# Patient Record
Sex: Female | Born: 1955
Health system: Southern US, Community
[De-identification: ages and names within clinical notes are randomized; demographics above are authoritative.]

## PROBLEM LIST (undated history)

## (undated) DIAGNOSIS — I10 Essential (primary) hypertension: Secondary | ICD-10-CM

## (undated) DIAGNOSIS — J302 Other seasonal allergic rhinitis: Secondary | ICD-10-CM

## (undated) HISTORY — DX: Other seasonal allergic rhinitis: J30.2

## (undated) HISTORY — DX: Essential (primary) hypertension: I10

---

## 1990-05-13 HISTORY — PX: ABDOMINAL HYSTERECTOMY: SHX81

## 2018-03-24 ENCOUNTER — Encounter: Payer: Self-pay | Admitting: Podiatry

## 2018-03-24 ENCOUNTER — Ambulatory Visit: Payer: BC Managed Care – PPO | Admitting: Podiatry

## 2018-03-24 ENCOUNTER — Ambulatory Visit (INDEPENDENT_AMBULATORY_CARE_PROVIDER_SITE_OTHER): Payer: BC Managed Care – PPO

## 2018-03-24 VITALS — BP 129/83 | HR 75 | Resp 16

## 2018-03-24 DIAGNOSIS — M722 Plantar fascial fibromatosis: Secondary | ICD-10-CM

## 2018-03-24 MED ORDER — MELOXICAM 15 MG PO TABS: 15.0000 mg | ORAL_TABLET | Freq: Every day | ORAL | 3 refills | Status: DC

## 2018-03-24 MED ORDER — METHYLPREDNISOLONE 4 MG PO TBPK: ORAL_TABLET | ORAL | 0 refills | Status: DC

## 2018-03-24 NOTE — Patient Instructions (Signed)
For instructions on how to put on your Night Splint, please visit [email or web address]   Plantar Fasciitis (Heel Spur Syndrome) with Rehab The plantar fascia is a fibrous, ligament-like, soft-tissue structure that spans the bottom of the foot. Plantar fasciitis is a condition that causes pain in the foot due to inflammation of the tissue. SYMPTOMS   Pain and tenderness on the underneath side of the foot.  Pain that worsens with standing or walking. CAUSES  Plantar fasciitis is caused by irritation and injury to the plantar fascia on the underneath side of the foot. Common mechanisms of injury include:  Direct trauma to bottom of the foot.  Damage to a small nerve that runs under the foot where the main fascia attaches to the heel bone.  Stress placed on the plantar fascia due to bone spurs. RISK INCREASES WITH:   Activities that place stress on the plantar fascia (running, jumping, pivoting, or cutting).  Poor strength and flexibility.  Improperly fitted shoes.  Tight calf muscles.  Flat feet.  Failure to warm-up properly before activity.  Obesity. PREVENTION  Warm up and stretch properly before activity.  Allow for adequate recovery between workouts.  Maintain physical fitness:  Strength, flexibility, and endurance.  Cardiovascular fitness.  Maintain a health body weight.  Avoid stress on the plantar fascia.  Wear properly fitted shoes, including arch supports for individuals who have flat feet.  PROGNOSIS  If treated properly, then the symptoms of plantar fasciitis usually resolve without surgery. However, occasionally surgery is necessary.  RELATED COMPLICATIONS   Recurrent symptoms that may result in a chronic condition.  Problems of the lower back that are caused by compensating for the injury, such as limping.  Pain or weakness of the foot during push-off following surgery.  Chronic inflammation, scarring, and partial or complete fascia tear,  occurring more often from repeated injections.  TREATMENT  Treatment initially involves the use of ice and medication to help reduce pain and inflammation. The use of strengthening and stretching exercises may help reduce pain with activity, especially stretches of the Achilles tendon. These exercises may be performed at home or with a therapist. Your caregiver may recommend that you use heel cups of arch supports to help reduce stress on the plantar fascia. Occasionally, corticosteroid injections are given to reduce inflammation. If symptoms persist for greater than 6 months despite non-surgical (conservative), then surgery may be recommended.   MEDICATION   If pain medication is necessary, then nonsteroidal anti-inflammatory medications, such as aspirin and ibuprofen, or other minor pain relievers, such as acetaminophen, are often recommended.  Do not take pain medication within 7 days before surgery.  Prescription pain relievers may be given if deemed necessary by your caregiver. Use only as directed and only as much as you need.  Corticosteroid injections may be given by your caregiver. These injections should be reserved for the most serious cases, because they may only be given a certain number of times.  HEAT AND COLD  Cold treatment (icing) relieves pain and reduces inflammation. Cold treatment should be applied for 10 to 15 minutes every 2 to 3 hours for inflammation and pain and immediately after any activity that aggravates your symptoms. Use ice packs or massage the area with a piece of ice (ice massage).  Heat treatment may be used prior to performing the stretching and strengthening activities prescribed by your caregiver, physical therapist, or athletic trainer. Use a heat pack or soak the injury in warm water.  SEEK IMMEDIATE MEDICAL CARE   IF:  Treatment seems to offer no benefit, or the condition worsens.  Any medications produce adverse side effects.  EXERCISES- RANGE OF  MOTION (ROM) AND STRETCHING EXERCISES - Plantar Fasciitis (Heel Spur Syndrome) These exercises may help you when beginning to rehabilitate your injury. Your symptoms may resolve with or without further involvement from your physician, physical therapist or athletic trainer. While completing these exercises, remember:   Restoring tissue flexibility helps normal motion to return to the joints. This allows healthier, less painful movement and activity.  An effective stretch should be held for at least 30 seconds.  A stretch should never be painful. You should only feel a gentle lengthening or release in the stretched tissue.  RANGE OF MOTION - Toe Extension, Flexion  Sit with your right / left leg crossed over your opposite knee.  Grasp your toes and gently pull them back toward the top of your foot. You should feel a stretch on the bottom of your toes and/or foot.  Hold this stretch for 10 seconds.  Now, gently pull your toes toward the bottom of your foot. You should feel a stretch on the top of your toes and or foot.  Hold this stretch for 10 seconds. Repeat  times. Complete this stretch 3 times per day.   RANGE OF MOTION - Ankle Dorsiflexion, Active Assisted  Remove shoes and sit on a chair that is preferably not on a carpeted surface.  Place right / left foot under knee. Extend your opposite leg for support.  Keeping your heel down, slide your right / left foot back toward the chair until you feel a stretch at your ankle or calf. If you do not feel a stretch, slide your bottom forward to the edge of the chair, while still keeping your heel down.  Hold this stretch for 10 seconds. Repeat 3 times. Complete this stretch 2 times per day.   STRETCH  Gastroc, Standing  Place hands on wall.  Extend right / left leg, keeping the front knee somewhat bent.  Slightly point your toes inward on your back foot.  Keeping your right / left heel on the floor and your knee straight, shift  your weight toward the wall, not allowing your back to arch.  You should feel a gentle stretch in the right / left calf. Hold this position for 10 seconds. Repeat 3 times. Complete this stretch 2 times per day.  STRETCH  Soleus, Standing  Place hands on wall.  Extend right / left leg, keeping the other knee somewhat bent.  Slightly point your toes inward on your back foot.  Keep your right / left heel on the floor, bend your back knee, and slightly shift your weight over the back leg so that you feel a gentle stretch deep in your back calf.  Hold this position for 10 seconds. Repeat 3 times. Complete this stretch 2 times per day.  STRETCH  Gastrocsoleus, Standing  Note: This exercise can place a lot of stress on your foot and ankle. Please complete this exercise only if specifically instructed by your caregiver.   Place the ball of your right / left foot on a step, keeping your other foot firmly on the same step.  Hold on to the wall or a rail for balance.  Slowly lift your other foot, allowing your body weight to press your heel down over the edge of the step.  You should feel a stretch in your right / left calf.  Hold this position   for 10 seconds.  Repeat this exercise with a slight bend in your right / left knee. Repeat 3 times. Complete this stretch 2 times per day.   STRENGTHENING EXERCISES - Plantar Fasciitis (Heel Spur Syndrome)  These exercises may help you when beginning to rehabilitate your injury. They may resolve your symptoms with or without further involvement from your physician, physical therapist or athletic trainer. While completing these exercises, remember:   Muscles can gain both the endurance and the strength needed for everyday activities through controlled exercises.  Complete these exercises as instructed by your physician, physical therapist or athletic trainer. Progress the resistance and repetitions only as guided.  STRENGTH - Towel Curls  Sit in  a chair positioned on a non-carpeted surface.  Place your foot on a towel, keeping your heel on the floor.  Pull the towel toward your heel by only curling your toes. Keep your heel on the floor. Repeat 3 times. Complete this exercise 2 times per day.  STRENGTH - Ankle Inversion  Secure one end of a rubber exercise band/tubing to a fixed object (table, pole). Loop the other end around your foot just before your toes.  Place your fists between your knees. This will focus your strengthening at your ankle.  Slowly, pull your big toe up and in, making sure the band/tubing is positioned to resist the entire motion.  Hold this position for 10 seconds.  Have your muscles resist the band/tubing as it slowly pulls your foot back to the starting position. Repeat 3 times. Complete this exercises 2 times per day.  Document Released: 04/29/2005 Document Revised: 07/22/2011 Document Reviewed: 08/11/2008 [employer] Patient Information 2014 [employer].  

## 2018-03-25 NOTE — Progress Notes (Signed)
  Subjective:  Patient ID: Lisa Buckley, female    DOB: [birth year],  MRN: [id number] HPI Chief Complaint  Patient presents with  . Foot Pain    Plantar heel bilateral (R>L) - aching x 3 weeks, AM pain, feeling numbness whole plantar side x months, discoloration medial foot right, tried Aleve  . New Patient (Initial Visit)    62 y.o. female presents with the above complaint.   ROS: Denies fever chills nausea vomiting muscle aches pains calf pain back pain chest pain shortness of breath.  No past medical history on file.   Current Outpatient Medications:  .  naproxen sodium (ALEVE) 220 MG tablet, Take 220 mg by mouth., Disp: , Rfl:  .  meloxicam (MOBIC) 15 MG tablet, Take 1 tablet (15 mg total) by mouth daily., Disp: 30 tablet, Rfl: 3 .  methylPREDNISolone (MEDROL DOSEPAK) 4 MG TBPK tablet, 6 day dose pack - take as directed, Disp: 21 tablet, Rfl: 0 .  telmisartan-hydrochlorothiazide (MICARDIS HCT) 80-25 MG tablet, TK 1 T PO QD, Disp: , Rfl: 1  Allergies  Allergen Reactions  . Morphine And Related    Review of Systems Objective:   Vitals:   03/24/18 1535  BP: 129/83  Pulse: 75  Resp: 16    General: Well developed, nourished, in no acute distress, alert and oriented x3   Dermatological: Skin is warm, dry and supple bilateral. Nails x 10 are well maintained; remaining integument appears unremarkable at this time. There are no open sores, no preulcerative lesions, no rash or signs of infection present.  Vascular: Dorsalis Pedis artery and Posterior Tibial artery pedal pulses are 2/4 bilateral with immedate capillary fill time. Pedal hair growth present. No varicosities and no lower extremity edema present bilateral.   Neruologic: Grossly intact via light touch bilateral. Vibratory intact via tuning fork bilateral. Protective threshold with Semmes Wienstein monofilament intact to all pedal sites bilateral. Patellar and Achilles deep tendon reflexes 2+ bilateral. No Babinski or  clonus noted bilateral.   Musculoskeletal: No gross boney pedal deformities bilateral. No pain, crepitus, or limitation noted with foot and ankle range of motion bilateral. Muscular strength 5/5 in all groups tested bilateral.  Gait: Unassisted, Nonantalgic.    Radiographs:  Radiographs taken today demonstrate an osseously mature individual with mild pes planus soft tissue increase in density plantar calcaneal insertion site of the right heel.  No acute findings.  Assessment & Plan:   Assessment: Plantar fasciitis and pes planus.   Plan: We discussed etiology pathology conservative surgical therapies at this point I injected her right heel today with Kenalog and local anesthetic after sterile Betadine skin prep.  Tolerated procedure well without complications.  Also placed on a Medrol Dosepak to be followed by meloxicam.  Discussed appropriate shoe gear stretching exercise ice therapy sugar modifications dispensed plantar fascial brace and a night splint.     Lisa Buckley T. [place], [place]

## 2018-04-23 ENCOUNTER — Ambulatory Visit: Payer: BC Managed Care – PPO | Admitting: Podiatry

## 2019-08-03 ENCOUNTER — Telehealth: Payer: Self-pay | Admitting: Adult Health

## 2019-08-03 ENCOUNTER — Encounter: Payer: Self-pay | Admitting: Adult Health

## 2019-08-03 NOTE — Telephone Encounter (Signed)
Received a new hem referral from Dr. Nancy Fetter at [facility] for essential thrombocythemia. Lisa Buckley has been cld and scheduled to see Lisa Buckley on 4/12 at 1130am w/labs at 11am. Pt aware to arrive 15 minutes early. Letter mailed.

## 2019-08-09 NOTE — Progress Notes (Addendum)
[place]  [phone] [phone]     ID: Brittanie Dosanjh [facility] DOB: [birth year]  MR#: [id number]  KGY#:[id number]  Patient Care Team: Donald Prose, MD as PCP - General (Family Medicine) Scot Dock, NP OTHER MD:  CHIEF COMPLAINT: thrombocytosis  CURRENT TREATMENT:  Diagnostic work-up   HISTORY OF CURRENT ILLNESS:  Seville has been seeing her PCP annually with labs.  Per Melvine's report, she notes that in 2020 when she went to see Dr. Nancy Fetter, her platelet count was mildly elevated.  The following year, in 07/2019 when she went for her next physical and had her labs drawn again her platelet count was elevated again to 682.  At that point, Gerilynn was referred to see hematology, as her platelet count continued to rise.  There has been no history of infection, iron deficiency, bleeding, or any other lab abnormality.    Her CBC on 08/02/2019 is as follows:  WBC     5.8 RBC     4.52 HGB     14.2 HCT     41.8 MCV     92.5 MCH     31.4 MCHC  34.0 RDW   13.2 PLT      682  The patient's subsequent history is as detailed below.  INTERVAL HISTORY: [place] is feeling well today.  She notes that she is concerned about her elevated platelets, but in general feels well.  She has some foot pain for which she sees podiatry, and has been diagnosed with plantar fasciitis.  She notes she recently had a skin biopsy on 08/12/2019 due to some red marks on her ankle.    REVIEW OF SYSTEMS: Amri denies any fever or chills.  She has occasional night sweats, and rare hot flashes during the day.  She enjoys riding her bike daily.  She denies any lymphadenopathy, unintentional weight loss, bowel/bladder changes, headaches, cough, shortness of breath, chest pain, palpitations, headaches, vision issues, or any other concerns.  A detailed ROS was otherwise non contributory.    PAST MEDICAL HISTORY: Past Medical History:  Diagnosis Date  . HTN (hypertension)   . Seasonal allergies     PAST  SURGICAL HISTORY: Past Surgical History:  Procedure Laterality Date  . ABDOMINAL HYSTERECTOMY  1992    FAMILY HISTORY Family History  Problem Relation Age of Onset  . Hypertension Mother   . Hypertension Father   . Colon cancer Sister   . Colon cancer Brother     GYNECOLOGIC HISTORY:  Menarche: 64 years old Age at first live birth: 64 years old [place] P 1 LMP 1992 Contraceptive no HRT no  Hysterectomy? 1992 Salpingo-oophorectomy? no    SOCIAL HISTORY: Athira is married and lives with her husband in [place], [place].  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  She enjoys riding her bike and denies ETOH, drugs, or current tobacco use.  She is a former smoker and quit in 1992.     ADVANCED DIRECTIVES: not in place   HEALTH MAINTENANCE: Social History   Tobacco Use  . Smoking status: Former Research scientist (life sciences)  . Smokeless tobacco: Never Used  Substance Use Topics  . Alcohol use: Yes    Comment: rarely  . Drug use: Not on file     Colonoscopy: 2018 (every 5 years)  PAP: 2018 (has regular pelvic exams)  Mammo: 08/20/2019   Allergies  Allergen Reactions  . Morphine And Related     Current Outpatient Medications  Medication Sig Dispense  Refill  . cetirizine (ZYRTEC) 10 MG tablet Take 10 mg by mouth daily.    Marland Kitchen esomeprazole (NEXIUM) 40 MG capsule Take 40 mg by mouth daily at 12 noon.    . naproxen sodium (ALEVE) 220 MG tablet Take 220 mg by mouth.    . telmisartan-hydrochlorothiazide (MICARDIS HCT) 80-25 MG tablet TK 1 T PO QD  1  . meloxicam (MOBIC) 15 MG tablet Take 1 tablet (15 mg total) by mouth daily. (Patient not taking: Reported on 08/23/2019) 30 tablet 3  . methylPREDNISolone (MEDROL DOSEPAK) 4 MG TBPK tablet 6 day dose pack - take as directed (Patient not taking: Reported on 08/23/2019) 21 tablet 0   No current facility-administered medications for this visit.    OBJECTIVE:  Vitals:   08/23/19 1220  BP: (!) 158/92  Pulse: 87  Resp: 18  Temp: 98.7  F (37.1 C)  SpO2: 100%     Body mass index is 31.56 kg/m.   Wt Readings from Last 3 Encounters:  08/23/19 192 lb 9.6 oz (87.4 kg)      ECOG FS:0 - Asymptomatic GENERAL: Patient is a well appearing female in no acute distress HEENT:  Sclerae anicteric.  Oropharynx clear and moist. No ulcerations or evidence of oropharyngeal candidiasis. Neck is supple.  NODES:  No cervical, supraclavicular, or axillary lymphadenopathy palpated.  BREAST EXAM:  Deferred. LUNGS:  Clear to auscultation bilaterally.  No wheezes or rhonchi. HEART:  Regular rate and rhythm. No murmur appreciated. ABDOMEN:  Soft, nontender.  Positive, normoactive bowel sounds. No organomegaly palpated. MSK:  No focal spinal tenderness to palpation. EXTREMITIES:  No peripheral edema.   SKIN:  Clear with no obvious rashes or skin changes. No nail dyscrasia. NEURO:  Nonfocal. Well oriented.  Appropriate affect.     LAB RESULTS:  CMP     Component Value Date/Time   NA 141 08/23/2019 1159   K 4.0 08/23/2019 1159   CL 105 08/23/2019 1159   CO2 29 08/23/2019 1159   GLUCOSE 95 08/23/2019 1159   BUN 14 08/23/2019 1159   CREATININE 1.09 (H) 08/23/2019 1159   CALCIUM 10.6 (H) 08/23/2019 1159   PROT 7.2 08/23/2019 1159   ALBUMIN 4.3 08/23/2019 1159   AST 30 08/23/2019 1159   ALT 30 08/23/2019 1159   ALKPHOS 98 08/23/2019 1159   BILITOT 1.7 (H) 08/23/2019 1159   GFRNONAA 54 (L) 08/23/2019 1159   GFRAA >60 08/23/2019 1159    No results found for: TOTALPROTELP, ALBUMINELP, A1GS, A2GS, BETS, BETA2SER, GAMS, MSPIKE, SPEI  No results found for: Nils Pyle, [facility]  Lab Results  Component Value Date   WBC 6.8 08/23/2019   NEUTROABS 4.9 08/23/2019   HGB 14.6 08/23/2019   HCT 45.2 08/23/2019   MCV 94.8 08/23/2019   PLT 890 (H) 08/23/2019      Chemistry      Component Value Date/Time   NA 141 08/23/2019 1159   K 4.0 08/23/2019 1159   CL 105 08/23/2019 1159   CO2 29 08/23/2019 1159   BUN 14  08/23/2019 1159   CREATININE 1.09 (H) 08/23/2019 1159      Component Value Date/Time   CALCIUM 10.6 (H) 08/23/2019 1159   ALKPHOS 98 08/23/2019 1159   AST 30 08/23/2019 1159   ALT 30 08/23/2019 1159   BILITOT 1.7 (H) 08/23/2019 1159       No results found for: LABCA2  No components found for: YQMGNO037  No results for input(s): INR in the last 168 hours.  No results found for: LABCA2  No results found for: PXT062  No results found for: IRS854  No results found for: OEV035  No results found for: CA2729  No components found for: HGQUANT  No results found for: CEA1 / No results found for: CEA1   No results found for: AFPTUMOR  No results found for: CHROMOGRNA  No results found for: PSA1  Appointment on 08/23/2019  Component Date Value Ref Range Status  . CRP 08/23/2019 0.6  <1.0 mg/dL Final   Performed at [facility] [address], [place], [place] [address]  . Sed Rate 08/23/2019 0  0 - 22 mm/hr Final   Performed at [facility] [address] [place], [place] [address]  . Ferritin 08/23/2019 162  11 - 307 ng/mL Final   Performed at [facility], [place] [address] [place], [place] [address]  . Iron 08/23/2019 94  41 - 142 ug/dL Final  . TIBC 08/23/2019 313  236 - 444 ug/dL Final  . Saturation Ratios 08/23/2019 30  21 - 57 % Final  . UIBC 08/23/2019 218  120 - 384 ug/dL Final   Performed at [facility], [place] [address], [place], [place] [address]  . Smear Review 08/23/2019 SMEAR STAINED AND AVAILABLE FOR REVIEW   Final   Performed at [facility], [address], [place], [place] [address]  . Sodium 08/23/2019 141  135 - 145 mmol/L Final  . Potassium 08/23/2019 4.0  3.5 - 5.1 mmol/L Final  . Chloride 08/23/2019 105  98 - 111 mmol/L Final  . CO2 08/23/2019 29  22 - 32 mmol/L Final  . Glucose, Bld 08/23/2019 95  70 - 99 mg/dL Final   Glucose  reference range applies only to samples taken after fasting for at least 8 hours.  . BUN 08/23/2019 14  8 - 23 mg/dL Final  . Creatinine 08/23/2019 1.09* 0.44 - 1.00 mg/dL Final  . Calcium 08/23/2019 10.6* 8.9 - 10.3 mg/dL Final  . Total Protein 08/23/2019 7.2  6.5 - 8.1 g/dL Final  . Albumin 08/23/2019 4.3  3.5 - 5.0 g/dL Final  . AST 08/23/2019 30  15 - 41 U/L Final  . ALT 08/23/2019 30  0 - 44 U/L Final  . Alkaline Phosphatase 08/23/2019 98  38 - 126 U/L Final  . Total Bilirubin 08/23/2019 1.7* 0.3 - 1.2 mg/dL Final  . GFR, Est Non Af Am 08/23/2019 54* >60 mL/min Final  . GFR, Est AFR Am 08/23/2019 >60  >60 mL/min Final  . Anion gap 08/23/2019 7  5 - 15 Final   Performed at [facility], [place] [address] [place],  [address]  . WBC Count 08/23/2019 6.8  4.0 - 10.5 K/uL Final  . RBC 08/23/2019 4.77  3.87 - 5.11 MIL/uL Final  . Hemoglobin 08/23/2019 14.6  12.0 - 15.0 g/dL Final  . HCT 08/23/2019 45.2  36.0 - 46.0 % Final  . MCV 08/23/2019 94.8  80.0 - 100.0 fL Final  . MCH 08/23/2019 30.6  26.0 - 34.0 pg Final  . MCHC 08/23/2019 32.3  30.0 - 36.0 g/dL Final  . RDW 08/23/2019 12.6  11.5 - 15.5 % Final  . Platelet Count 08/23/2019 890* 150 - 400 K/uL Final  . nRBC 08/23/2019 0.0  0.0 - 0.2 % Final  . Neutrophils Relative % 08/23/2019 72  % Final  . Neutro Abs 08/23/2019 4.9  1.7 -  7.7 K/uL Final  . Lymphocytes Relative 08/23/2019 21  % Final  . Lymphs Abs 08/23/2019 1.4  0.7 - 4.0 K/uL Final  . Monocytes Relative 08/23/2019 5  % Final  . Monocytes Absolute 08/23/2019 0.4  0.1 - 1.0 K/uL Final  . Eosinophils Relative 08/23/2019 1  % Final  . Eosinophils Absolute 08/23/2019 0.1  0.0 - 0.5 K/uL Final  . Basophils Relative 08/23/2019 1  % Final  . Basophils Absolute 08/23/2019 0.1  0.0 - 0.1 K/uL Final  . Immature Granulocytes 08/23/2019 0  % Final  . Abs Immature Granulocytes 08/23/2019 0.01  0.00 - 0.07 K/uL Final   Performed at [facility], [place] [address], [place], [place] [address]    (this displays the last labs from the last 3 days)  No results found for: TOTALPROTELP, ALBUMINELP, A1GS, A2GS, BETS, BETA2SER, GAMS, MSPIKE, SPEI (this displays SPEP labs)  No results found for: KPAFRELGTCHN, LAMBDASER, KAPLAMBRATIO (kappa/lambda light chains)  No results found for: HGBA, HGBA2QUANT, HGBFQUANT, HGBSQUAN (Hemoglobinopathy evaluation)   No results found for: LDH  Lab Results  Component Value Date   IRON 94 08/23/2019   TIBC 313 08/23/2019   IRONPCTSAT 30 08/23/2019   (Iron and TIBC)  Lab Results  Component Value Date   FERRITIN 162 08/23/2019    Urinalysis No results found for: COLORURINE, APPEARANCEUR, LABSPEC, PHURINE, GLUCOSEU, HGBUR, BILIRUBINUR, KETONESUR, PROTEINUR, UROBILINOGEN, NITRITE, LEUKOCYTESUR   STUDIES: No results found.    ASSESSMENT: 64 y.o. [place] woman who is here today for evaluation of increasing thrombocytosis.    1.  Thrombocytosis  (a) platelet count 682 on 07/12/2019  (b) platelets are 890 today  (c) suspect Essential Thrombocytosis, Jak2 pending  (d) iron panel, inflammatory markers pending to rule out reactive process  PLAN:  After discussing the above with Dr. Jana Hakim, and reviewing her blood smear, we sat down with York Cerise to review everything.  We reviewed that all blood cells are made in the marrow.  There are 3 main types of blood cells.    1. Red blood cells which carry oxygen.  Aalayah's are normal in number and appearance. They don't appear to be small, so iron deficiency is unlikely.    2.  White blood cells, which are immune cells.  Joneisha's are normal in number, and in appearance.  There is not a sign of leukemia on her blood smear.    3.  Platelets, which are clotting cells.  Anniyah's are increased in number.    Dr. Jana Hakim discussed that we are running several labs to evaluate whether this is reactive thrombocytosis, meaning increased platelets due to another  issue I.e. inflammation, or iron deficiency.    The second possibility is Essential Thrombocytosis; essential meaning, not caused by something else. Dr. Jana Hakim reviewed that essential thrombocytosis or ET is usually associated with an acquired mutation in bone marrow.  There are four genes that can cause this, with JAK2 being the most common.  If this is the diagnosis, then there is concern that Zarayah's platelets are not functioning normally, which can lead to increased clots, or bleeding.  This condition is chronic, however treatable.    Dr. Jana Hakim also reviewed with York Cerise that she will likely need a bone marrow biopsy to evaluate the marrow once we have her labs back.    I will call Jahzara once I receive all of her lab work.  At that point we will decide about doing a bone marrow biopsy.  Once we  have all of the information, she will see Dr. Jana Hakim in follow up.  Adriannah is in agreement with this plan.    She was recommended to continue with the appropriate pandemic precautions. She knows to call for any questions that may arise between now and her next appointment.  We are happy to see her sooner if needed.  Total encounter time: 60 minutes*   Wilber Bihari, NP Medical Oncology and Hematology [facility] [address] [place], [place] [address] Tel. [phone]    Fax. [phone]   ADDENDUM:  Ms Occhipinti is approaching 1 million the platelets, without any obvious provoking cause.  In particular she has a ferritin greater than 100 and a normal iron saturation which means she does not have iron deficiency, the most common cause of a thrombocytosis.  There is no evidence of an inflammatory process, with a normal sed rate and C-reactive protein.  She is not on any medications that might stimulate thrombocytosis.  I think particularly given the normal white cell count and hemoglobin and the absence of any dysplasia or immaturity in the peripheral blood film, she is a good candidate  to have essential thrombocytosis.  We discussed this at length, and she understands that essential thrombocytosis is not curable but is very treatable, that the prognosis is good, and that this is generally due to an acquired mutation, most frequently in JAK2, and that we will need the genetic data before making a definitive diagnosis.  If she does have essential thrombocytosis we will obtain a baseline bone marrow biopsy.  Ms. Eisenhuth had a good understanding of the differential and work-up in process and will see [place] again as soon as we have more definitive data.  Chauncey Cruel MD Medical Oncology and Hematology [facility]  *Total Encounter Time as defined by the Centers for Medicare and Medicaid Services includes, in addition to the face-to-face time of a patient visit (documented in the note above) non-face-to-face time: obtaining and reviewing outside history, ordering and reviewing medications, tests or procedures, care coordination (communications with other health care professionals or caregivers) and documentation in the medical record.

## 2019-08-11 ENCOUNTER — Telehealth: Payer: Self-pay | Admitting: Adult Health

## 2019-08-11 NOTE — Telephone Encounter (Signed)
Lisa Buckley has been cld and made aware that her appts have been moved to 11:30am for labs and 12pm to see Mendel Ryder.

## 2019-08-23 ENCOUNTER — Inpatient Hospital Stay: Payer: BC Managed Care – PPO

## 2019-08-23 ENCOUNTER — Encounter: Payer: Self-pay | Admitting: Oncology

## 2019-08-23 ENCOUNTER — Inpatient Hospital Stay: Payer: BC Managed Care – PPO | Attending: Adult Health | Admitting: Adult Health

## 2019-08-23 ENCOUNTER — Encounter: Payer: Self-pay | Admitting: Adult Health

## 2019-08-23 ENCOUNTER — Telehealth: Payer: Self-pay

## 2019-08-23 ENCOUNTER — Other Ambulatory Visit: Payer: Self-pay

## 2019-08-23 VITALS — BP 158/92 | HR 87 | Temp 98.7°F | Resp 18 | Ht 65.5 in | Wt 192.6 lb

## 2019-08-23 DIAGNOSIS — Z8249 Family history of ischemic heart disease and other diseases of the circulatory system: Secondary | ICD-10-CM | POA: Insufficient documentation

## 2019-08-23 DIAGNOSIS — Z885 Allergy status to narcotic agent status: Secondary | ICD-10-CM | POA: Insufficient documentation

## 2019-08-23 DIAGNOSIS — Z79899 Other long term (current) drug therapy: Secondary | ICD-10-CM | POA: Diagnosis not present

## 2019-08-23 DIAGNOSIS — D473 Essential (hemorrhagic) thrombocythemia: Secondary | ICD-10-CM | POA: Insufficient documentation

## 2019-08-23 DIAGNOSIS — E66811 Obesity, class 1: Secondary | ICD-10-CM

## 2019-08-23 DIAGNOSIS — Z800 Family history of malignant neoplasm of digestive organs: Secondary | ICD-10-CM | POA: Insufficient documentation

## 2019-08-23 DIAGNOSIS — J302 Other seasonal allergic rhinitis: Secondary | ICD-10-CM

## 2019-08-23 DIAGNOSIS — I10 Essential (primary) hypertension: Secondary | ICD-10-CM | POA: Diagnosis not present

## 2019-08-23 DIAGNOSIS — M722 Plantar fascial fibromatosis: Secondary | ICD-10-CM | POA: Diagnosis not present

## 2019-08-23 DIAGNOSIS — R7989 Other specified abnormal findings of blood chemistry: Secondary | ICD-10-CM | POA: Insufficient documentation

## 2019-08-23 DIAGNOSIS — Z87891 Personal history of nicotine dependence: Secondary | ICD-10-CM | POA: Insufficient documentation

## 2019-08-23 DIAGNOSIS — D75839 Thrombocytosis, unspecified: Secondary | ICD-10-CM

## 2019-08-23 DIAGNOSIS — E669 Obesity, unspecified: Secondary | ICD-10-CM | POA: Diagnosis not present

## 2019-08-23 LAB — SAVE SMEAR(SSMR), FOR PROVIDER SLIDE REVIEW

## 2019-08-23 LAB — CBC WITH DIFFERENTIAL (CANCER CENTER ONLY)
Abs Immature Granulocytes: 0.01 10*3/uL (ref 0.00–0.07)
Basophils Absolute: 0.1 10*3/uL (ref 0.0–0.1)
Basophils Relative: 1 %
Eosinophils Absolute: 0.1 10*3/uL (ref 0.0–0.5)
Eosinophils Relative: 1 %
HCT: 45.2 % (ref 36.0–46.0)
Hemoglobin: 14.6 g/dL (ref 12.0–15.0)
Immature Granulocytes: 0 %
Lymphocytes Relative: 21 %
Lymphs Abs: 1.4 10*3/uL (ref 0.7–4.0)
MCH: 30.6 pg (ref 26.0–34.0)
MCHC: 32.3 g/dL (ref 30.0–36.0)
MCV: 94.8 fL (ref 80.0–100.0)
Monocytes Absolute: 0.4 10*3/uL (ref 0.1–1.0)
Monocytes Relative: 5 %
Neutro Abs: 4.9 10*3/uL (ref 1.7–7.7)
Neutrophils Relative %: 72 %
Platelet Count: 890 10*3/uL — ABNORMAL HIGH (ref 150–400)
RBC: 4.77 MIL/uL (ref 3.87–5.11)
RDW: 12.6 % (ref 11.5–15.5)
WBC Count: 6.8 10*3/uL (ref 4.0–10.5)
nRBC: 0 % (ref 0.0–0.2)

## 2019-08-23 LAB — IRON AND TIBC
Iron: 94 ug/dL (ref 41–142)
Saturation Ratios: 30 % (ref 21–57)
TIBC: 313 ug/dL (ref 236–444)
UIBC: 218 ug/dL (ref 120–384)

## 2019-08-23 LAB — CMP (CANCER CENTER ONLY)
ALT: 30 U/L (ref 0–44)
AST: 30 U/L (ref 15–41)
Albumin: 4.3 g/dL (ref 3.5–5.0)
Alkaline Phosphatase: 98 U/L (ref 38–126)
Anion gap: 7 (ref 5–15)
BUN: 14 mg/dL (ref 8–23)
CO2: 29 mmol/L (ref 22–32)
Calcium: 10.6 mg/dL — ABNORMAL HIGH (ref 8.9–10.3)
Chloride: 105 mmol/L (ref 98–111)
Creatinine: 1.09 mg/dL — ABNORMAL HIGH (ref 0.44–1.00)
GFR, Est AFR Am: >60 mL/min (ref >60)
GFR, Estimated: 54 mL/min — ABNORMAL LOW (ref >60)
Glucose, Bld: 95 mg/dL (ref 70–99)
Potassium: 4 mmol/L (ref 3.5–5.1)
Sodium: 141 mmol/L (ref 135–145)
Total Bilirubin: 1.7 mg/dL — ABNORMAL HIGH (ref 0.3–1.2)
Total Protein: 7.2 g/dL (ref 6.5–8.1)

## 2019-08-23 LAB — SEDIMENTATION RATE: Sed Rate: 0 mm/hr (ref 0–22)

## 2019-08-23 LAB — FERRITIN: Ferritin: 162 ng/mL (ref 11–307)

## 2019-08-23 LAB — C-REACTIVE PROTEIN: CRP: 0.6 mg/dL (ref <1.0)

## 2019-08-23 NOTE — Telephone Encounter (Signed)
received critical lab call from lab. PLAT 890 thou. Mendel Ryder made aware.

## 2019-08-24 ENCOUNTER — Telehealth: Payer: Self-pay | Admitting: Adult Health

## 2019-08-24 ENCOUNTER — Encounter: Payer: Self-pay | Admitting: Adult Health

## 2019-08-24 DIAGNOSIS — J302 Other seasonal allergic rhinitis: Secondary | ICD-10-CM | POA: Insufficient documentation

## 2019-08-24 DIAGNOSIS — I10 Essential (primary) hypertension: Secondary | ICD-10-CM | POA: Insufficient documentation

## 2019-08-24 DIAGNOSIS — E669 Obesity, unspecified: Secondary | ICD-10-CM | POA: Insufficient documentation

## 2019-08-24 DIAGNOSIS — D473 Essential (hemorrhagic) thrombocythemia: Secondary | ICD-10-CM | POA: Insufficient documentation

## 2019-08-24 LAB — ANTINUCLEAR ANTIBODIES, IFA: ANA Ab, IFA: NEGATIVE

## 2019-08-24 NOTE — Telephone Encounter (Signed)
No 4/12 los. No changes made to pt's schedule.

## 2019-09-02 ENCOUNTER — Other Ambulatory Visit: Payer: Self-pay | Admitting: Adult Health

## 2019-09-02 ENCOUNTER — Telehealth: Payer: Self-pay | Admitting: Adult Health

## 2019-09-02 DIAGNOSIS — D75839 Thrombocytosis, unspecified: Secondary | ICD-10-CM

## 2019-09-02 DIAGNOSIS — D473 Essential (hemorrhagic) thrombocythemia: Secondary | ICD-10-CM

## 2019-09-02 NOTE — Telephone Encounter (Signed)
Scheduled appt per 4/22 sch message pt is aware of appt date and time   

## 2019-09-02 NOTE — Progress Notes (Signed)
JAK 2 is positive.  I called and reviewed results with her and recommendations for bone marrow biopsy and aspirate.  I reviewed she can do this in radiology and receive sedation, or have it completed at the bedside.  She would prefer sedation, as she is fearful of needles.  I have placed those orders, and she will see Dr. Jana Hakim in 2 weeks for labs and f/u to discuss those results.    Wilber Bihari, NP

## 2019-09-08 ENCOUNTER — Other Ambulatory Visit: Payer: Self-pay | Admitting: Radiology

## 2019-09-09 ENCOUNTER — Ambulatory Visit (HOSPITAL_COMMUNITY)
Admission: RE | Admit: 2019-09-09 | Discharge: 2019-09-09 | Disposition: A | Discharge status: Home / Self Care | Payer: BC Managed Care – PPO | Source: Ambulatory Visit | Attending: Adult Health | Admitting: Adult Health

## 2019-09-09 ENCOUNTER — Other Ambulatory Visit: Payer: Self-pay

## 2019-09-09 ENCOUNTER — Encounter (HOSPITAL_COMMUNITY): Payer: Self-pay

## 2019-09-09 DIAGNOSIS — Z87891 Personal history of nicotine dependence: Secondary | ICD-10-CM | POA: Insufficient documentation

## 2019-09-09 DIAGNOSIS — Z7902 Long term (current) use of antithrombotics/antiplatelets: Secondary | ICD-10-CM | POA: Insufficient documentation

## 2019-09-09 DIAGNOSIS — D473 Essential (hemorrhagic) thrombocythemia: Secondary | ICD-10-CM | POA: Insufficient documentation

## 2019-09-09 DIAGNOSIS — I10 Essential (primary) hypertension: Secondary | ICD-10-CM | POA: Insufficient documentation

## 2019-09-09 DIAGNOSIS — D75839 Thrombocytosis, unspecified: Secondary | ICD-10-CM

## 2019-09-09 DIAGNOSIS — Z79899 Other long term (current) drug therapy: Secondary | ICD-10-CM | POA: Diagnosis not present

## 2019-09-09 LAB — CBC WITH DIFFERENTIAL/PLATELET
Abs Immature Granulocytes: 0.02 10*3/uL (ref 0.00–0.07)
Basophils Absolute: 0.1 10*3/uL (ref 0.0–0.1)
Basophils Relative: 1 %
Eosinophils Absolute: 0.1 10*3/uL (ref 0.0–0.5)
Eosinophils Relative: 2 %
HCT: 44.7 % (ref 36.0–46.0)
Hemoglobin: 15 g/dL (ref 12.0–15.0)
Immature Granulocytes: 0 %
Lymphocytes Relative: 17 %
Lymphs Abs: 1.1 10*3/uL (ref 0.7–4.0)
MCH: 31.7 pg (ref 26.0–34.0)
MCHC: 33.6 g/dL (ref 30.0–36.0)
MCV: 94.5 fL (ref 80.0–100.0)
Monocytes Absolute: 0.5 10*3/uL (ref 0.1–1.0)
Monocytes Relative: 7 %
Neutro Abs: 4.7 10*3/uL (ref 1.7–7.7)
Neutrophils Relative %: 73 %
Platelets: 763 10*3/uL — ABNORMAL HIGH (ref 150–400)
RBC: 4.73 MIL/uL (ref 3.87–5.11)
RDW: 11.9 % (ref 11.5–15.5)
WBC: 6.3 10*3/uL (ref 4.0–10.5)
nRBC: 0 % (ref 0.0–0.2)

## 2019-09-09 LAB — PROTIME-INR
INR: 1.1 (ref 0.8–1.2)
Prothrombin Time: 13.4 seconds (ref 11.4–15.2)

## 2019-09-09 MED ORDER — FENTANYL CITRATE (PF) 100 MCG/2ML IJ SOLN
INTRAMUSCULAR | Status: AC | PRN
  Administered 2019-09-09 (×2): 50 ug via INTRAVENOUS

## 2019-09-09 MED ORDER — LIDOCAINE HCL (PF) 1 % IJ SOLN
INTRAMUSCULAR | Status: AC | PRN
  Administered 2019-09-09: 15 mL

## 2019-09-09 MED ORDER — MIDAZOLAM HCL 2 MG/2ML IJ SOLN
INTRAMUSCULAR | Status: AC
  Filled 2019-09-09: qty 4

## 2019-09-09 MED ORDER — FENTANYL CITRATE (PF) 100 MCG/2ML IJ SOLN
INTRAMUSCULAR | Status: AC
  Filled 2019-09-09: qty 2

## 2019-09-09 MED ORDER — SODIUM CHLORIDE 0.9 % IV SOLN: INTRAVENOUS | Status: DC

## 2019-09-09 MED ORDER — MIDAZOLAM HCL 2 MG/2ML IJ SOLN
INTRAMUSCULAR | Status: AC | PRN
  Administered 2019-09-09 (×3): 1 mg via INTRAVENOUS

## 2019-09-09 NOTE — H&P (Signed)
Chief Complaint: Patient was seen in consultation today for thrombocytosis/bone marrow biopsy and aspiration.  Referring Physician(s): Causey,Lindsey Cornetto  Supervising Physician: Sandi Mariscal  Patient Status: [facility] - Out-pt  History of Present Illness: Lisa Buckley is a 64 y.o. female with a past medical history of hypertension and seasonal allergies. She was referred to oncology by her PCP for management of progressive thrombocytosis seen on annual lab work. Work-up revealed patient was JACK2 positive, indicative of essential thrombocytosis.   IR requested by Wilber Bihari, NP for possible image-guided bone marrow biopsy/aspiration. Patient awake and alert laying in bed with no complaints at this time. Denies fever, chills, chest pain, dyspnea, abdominal pain, or headache.   Past Medical History:  Diagnosis Date  . HTN (hypertension)   . Seasonal allergies     Past Surgical History:  Procedure Laterality Date  . ABDOMINAL HYSTERECTOMY  1992    Allergies: Morphine and related  Medications: Prior to Admission medications   Medication Sig Start Date End Date Taking? Authorizing Provider  cetirizine (ZYRTEC) 10 MG tablet Take 10 mg by mouth daily.   Yes [provider]  esomeprazole (NEXIUM) 40 MG capsule Take 40 mg by mouth daily at 12 noon.   Yes [provider]  naproxen sodium (ALEVE) 220 MG tablet Take 220 mg by mouth.   Yes [provider]  telmisartan-hydrochlorothiazide (MICARDIS HCT) 80-25 MG tablet TK 1 T PO QD 01/17/18  Yes [provider]  meloxicam (MOBIC) 15 MG tablet Take 1 tablet (15 mg total) by mouth daily. Patient not taking: Reported on 08/23/2019 03/24/18   Hyatt, Max T, DPM  methylPREDNISolone (MEDROL DOSEPAK) 4 MG TBPK tablet 6 day dose pack - take as directed Patient not taking: Reported on 08/23/2019 03/24/18   Garrel Ridgel, DPM     Family History  Problem Relation Age of Onset  . Hypertension Mother   .  Hypertension Father   . Colon cancer Sister   . Colon cancer Brother     Social History   Socioeconomic History  . Marital status: Married    Spouse name: Not on file  . Number of children: Not on file  . Years of education: Not on file  . Highest education level: Not on file  Occupational History  . Not on file  Tobacco Use  . Smoking status: Former Research scientist (life sciences)  . Smokeless tobacco: Never Used  Substance and Sexual Activity  . Alcohol use: Yes    Comment: rarely  . Drug use: Not on file  . Sexual activity: Not on file  Other Topics Concern  . Not on file  Social History Narrative  . Not on file   Social Determinants of Health   Financial Resource Strain:   . Difficulty of Paying Living Expenses:   Food Insecurity:   . Worried About Charity fundraiser in the Last Year:   . Arboriculturist in the Last Year:   Transportation Needs:   . Film/video editor (Medical):   Marland Kitchen Lack of Transportation (Non-Medical):   Physical Activity:   . Days of Exercise per Week:   . Minutes of Exercise per Session:   Stress:   . Feeling of Stress :   Social Connections:   . Frequency of Communication with Friends and Family:   . Frequency of Social Gatherings with Friends and Family:   . Attends Religious Services:   . Active Member of Clubs or Organizations:   . Attends Club  or Organization Meetings:   Marland Kitchen Marital Status:      Review of Systems: A 12 point ROS discussed and pertinent positives are indicated in the HPI above.  All other systems are negative.  Review of Systems  Constitutional: Negative for chills and fever.  Respiratory: Negative for shortness of breath and wheezing.   Cardiovascular: Negative for chest pain and palpitations.  Gastrointestinal: Negative for abdominal pain.  Neurological: Negative for headaches.  Psychiatric/Behavioral: Negative for behavioral problems and confusion.    Vital Signs: BP 130/84   Pulse 92   Temp 98.3 F (36.8 C) (Oral)   Resp  18   Ht 5' 5"  (1.651 m)   Wt 189 lb (85.7 kg)   SpO2 99%   BMI 31.45 kg/m   Physical Exam Vitals and nursing note reviewed.  Constitutional:      General: She is not in acute distress.    Appearance: Normal appearance.  Cardiovascular:     Rate and Rhythm: Normal rate and regular rhythm.     Heart sounds: Normal heart sounds. No murmur.  Pulmonary:     Effort: Pulmonary effort is normal. No respiratory distress.     Breath sounds: Normal breath sounds. No wheezing.  Skin:    General: Skin is warm and dry.  Neurological:     Mental Status: She is alert and oriented to person, place, and time.  Psychiatric:        Mood and Affect: Mood normal.        Behavior: Behavior normal.      MD Evaluation Airway: WNL Heart: WNL Abdomen: WNL Chest/ Lungs: WNL ASA  Classification: 2 Mallampati/Airway Score: Two   Imaging: No results found.  Labs:  CBC: Recent Labs    08/23/19 1159 09/09/19 0800  WBC 6.8 6.3  HGB 14.6 15.0  HCT 45.2 44.7  PLT 890* 763*    COAGS: No results for input(s): INR, APTT in the last 8760 hours.  BMP: Recent Labs    08/23/19 1159  NA 141  K 4.0  CL 105  CO2 29  GLUCOSE 95  BUN 14  CALCIUM 10.6*  CREATININE 1.09*  GFRNONAA 54*  GFRAA >60    LIVER FUNCTION TESTS: Recent Labs    08/23/19 1159  BILITOT 1.7*  AST 30  ALT 30  ALKPHOS 98  PROT 7.2  ALBUMIN 4.3     Assessment and Plan:  Thrombocytosis, JAK2 positive. Plan for image-guided bone marrow biopsy/aspiration today in IR. Patient is NPO. Afebrile and WBCs WNL.  Risks and benefits discussed with the patient including, but not limited to bleeding, infection, damage to adjacent structures or low yield requiring additional tests. All of the patient's questions were answered, patient is agreeable to proceed. Consent signed and in chart.   Thank you for this interesting consult.  I greatly enjoyed meeting [facility] and look forward to participating in their  care.  A copy of this report was sent to the requesting provider on this date.  Electronically Signed: Earley Abide, PA-C 09/09/2019, 8:26 AM   I spent a total of 30 Minutes in face to face in clinical consultation, greater than 50% of which was counseling/coordinating care for thrombocytosis/bone marrow biopsy and aspiration.

## 2019-09-09 NOTE — Procedures (Signed)
Pre-procedure Diagnosis: Thrombocytosis Post-procedure Diagnosis: Same  Technically successful CT guided bone marrow aspiration and biopsy of left iliac crest.   Complications: None Immediate  EBL: None  Signed: Sandi Mariscal Pager: [phone] 09/09/2019, 9:19 AM

## 2019-09-09 NOTE — Discharge Instructions (Signed)
  Bone Marrow Aspiration and Bone Marrow Biopsy, Adult, Care After This sheet gives you information about how to care for yourself after your procedure. Your health care provider may also give you more specific instructions. If you have problems or questions, contact your health care provider. What can I expect after the procedure? After the procedure, it is common to have:  Mild pain and tenderness.  Swelling.  Bruising. Follow these instructions at home: Puncture site care   Follow instructions from your health care provider about how to take care of the puncture site. Make sure you: ? Wash your hands with soap and water before and after you change your bandage (dressing). If soap and water are not available, use hand sanitizer. ? Change your dressing as told by your health care provider.  Check your puncture site every day for signs of infection. Check for: ? More redness, swelling, or pain. ? Fluid or blood. ? Warmth. ? Pus or a bad smell. Activity  Return to your normal activities as told by your health care provider. Ask your health care provider what activities are safe for you.  Do not lift anything that is heavier than 10 lb (4.5 kg), or the limit that you are told, until your health care provider says that it is safe.  Do not drive for 24 hours if you were given a sedative during your procedure. General instructions   Take over-the-counter and prescription medicines only as told by your health care provider.  Do not take baths, swim, or use a hot tub until your health care provider approves. Ask your health care provider if you may take showers. You may only be allowed to take sponge baths.  If directed, put ice on the affected area. To do this: ? Put ice in a plastic bag. ? Place a towel between your skin and the bag. ? Leave the ice on for 20 minutes, 2-3 times a day.  Keep all follow-up visits as told by your health care provider. This is important. Contact  a health care provider if:  Your pain is not controlled with medicine.  You have a fever.  You have more redness, swelling, or pain around the puncture site.  You have fluid or blood coming from the puncture site.  Your puncture site feels warm to the touch.  You have pus or a bad smell coming from the puncture site. Summary  After the procedure, it is common to have mild pain, tenderness, swelling, and bruising.  Follow instructions from your health care provider about how to take care of the puncture site and what activities are safe for you.  Take over-the-counter and prescription medicines only as told by your health care provider.  Contact a health care provider if you have any signs of infection, such as fluid or blood coming from the puncture site. This information is not intended to replace advice given to you by your health care provider. Make sure you discuss any questions you have with your health care provider. Document Revised: 09/15/2018 Document Reviewed: 09/15/2018 Elsevier Patient Education  2020 Elsevier Inc. Moderate Conscious Sedation, Adult, Care After These instructions provide you with information about caring for yourself after your procedure. Your health care provider may also give you more specific instructions. Your treatment has been planned according to current medical practices, but problems sometimes occur. Call your health care provider if you have any problems or questions after your procedure. What can I expect after the procedure? After   your procedure, it is common:  To feel sleepy for several hours.  To feel clumsy and have poor balance for several hours.  To have poor judgment for several hours.  To vomit if you eat too soon. Follow these instructions at home: For at least 24 hours after the procedure:   Do not: ? Participate in activities where you could fall or become injured. ? Drive. ? Use heavy machinery. ? Drink alcohol. ? Take  sleeping pills or medicines that cause drowsiness. ? Make important decisions or sign legal documents. ? Take care of children on your own.  Rest. Eating and drinking  Follow the diet recommended by your health care provider.  If you vomit: ? Drink water, juice, or soup when you can drink without vomiting. ? Make sure you have little or no nausea before eating solid foods. General instructions  Have a responsible adult stay with you until you are awake and alert.  Take over-the-counter and prescription medicines only as told by your health care provider.  If you smoke, do not smoke without supervision.  Keep all follow-up visits as told by your health care provider. This is important. Contact a health care provider if:  You keep feeling nauseous or you keep vomiting.  You feel light-headed.  You develop a rash.  You have a fever. Get help right away if:  You have trouble breathing. This information is not intended to replace advice given to you by your health care provider. Make sure you discuss any questions you have with your health care provider. Document Revised: 04/11/2017 Document Reviewed: 08/19/2015 Elsevier Patient Education  2020 Elsevier Inc.  

## 2019-09-10 ENCOUNTER — Other Ambulatory Visit: Payer: Self-pay | Admitting: *Deleted

## 2019-09-12 NOTE — Progress Notes (Signed)
[place]  [phone] [phone]     ID: [facility] DOB: [birth year]  MR#: [id number]  WNU#:[id number]  Patient Care Team: Donald Prose, MD as PCP - General (Family Medicine) Chauncey Cruel, MD OTHER MD:  CHIEF COMPLAINT: essential thrombocytosis  CURRENT TREATMENT:  hydroxyurea   INTERVAL HISTORY: Lisa Buckley returns today for follow up of her thrombocytosis.  Since her last visit, she underwent bone marrow biopsy on 09/09/2019.  This showed a slightly hypercellular marrow with atypical megakaryocytic proliferation but no increase in blasts.  There was no significant increase in reticulin fibers or collagenous fibrosis.  We then obtained genomics on peripheral blood which showed a positive JAK2 mutation.  The patient is here today to discuss her diagnosis of essential thrombocytosis   REVIEW OF SYSTEMS: Lisa Buckley has had both her Pfizer vaccine doses as did her husband.  They tolerated them well with no unusual side effects.  A detailed review of systems today was otherwise noncontributory   HISTORY OF CURRENT ILLNESS: From the original intake note:  Lisa Buckley has been seeing her PCP annually with labs.  Per Lisa Buckley's report, she notes that in 2020 when she went to see Dr. Nancy Fetter, her platelet count was mildly elevated.  The following year, in 07/2019 when she went for her next physical and had her labs drawn again her platelet count was elevated again to 682.  At that point, Lisa Buckley was referred to see hematology, as her platelet count continued to rise.  There has been no history of infection, iron deficiency, bleeding, or any other lab abnormality.    Her CBC on 08/02/2019 is as follows:  WBC     5.8 RBC     4.52 HGB     14.2 HCT     41.8 MCV     92.5 MCH     31.4 MCHC  34.0 RDW   13.2 PLT      682  The patient's subsequent history is as detailed below.    PAST MEDICAL HISTORY: Past Medical History:  Diagnosis Date  . HTN (hypertension)   . Seasonal  allergies     PAST SURGICAL HISTORY: Past Surgical History:  Procedure Laterality Date  . ABDOMINAL HYSTERECTOMY  1992    FAMILY HISTORY Family History  Problem Relation Age of Onset  . Hypertension Mother   . Hypertension Father   . Colon cancer Sister   . Colon cancer Brother     GYNECOLOGIC HISTORY:  Menarche: 64 years old Age at first live birth: 64 years old Lisa Buckley P 1 LMP 1992 Contraceptive no HRT no  Hysterectomy? 1992 Salpingo-oophorectomy? 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  There is son Lisa Buckley runs a [employer] in [place].  The patient has 3 grandchildren aged 63, 42 and 71 as of April 2021.     ADVANCED DIRECTIVES: In the absence of any documents to the contrary the patient's husband is her healthcare power of attorney  HEALTH MAINTENANCE: Social History   Tobacco Use  . Smoking status: Former Research scientist (life sciences)  . Smokeless tobacco: Never Used  Substance Use Topics  . Alcohol use: Yes    Comment: rarely  . Drug use: Not on file     Colonoscopy: 2018 (every 5 years)  PAP: 2018 (has regular pelvic exams)  Mammo: 08/20/2019  Allergies  Allergen Reactions  . Morphine And Related     Current Outpatient Medications  Medication Sig Dispense Refill  . cetirizine (ZYRTEC) 10 MG tablet Take 10 mg by mouth daily.    Marland Kitchen esomeprazole (NEXIUM) 40 MG capsule Take 40 mg by mouth daily at 12 noon.    . meloxicam (MOBIC) 15 MG tablet Take 1 tablet (15 mg total) by mouth daily. (Patient not taking: Reported on 08/23/2019) 30 tablet 3  . methylPREDNISolone (MEDROL DOSEPAK) 4 MG TBPK tablet 6 day dose pack - take as directed (Patient not taking: Reported on 08/23/2019) 21 tablet 0  . naproxen sodium (ALEVE) 220 MG tablet Take 220 mg by mouth.    . telmisartan-hydrochlorothiazide (MICARDIS HCT) 80-25 MG  tablet TK 1 T PO QD  1   No current facility-administered medications for this visit.    OBJECTIVE: African-American woman in no acute distress  Vitals:   09/13/19 1525  BP: (!) 159/77  Pulse: 88  Resp: 20  Temp: 98.5 F (36.9 C)  SpO2: 100%     Body mass index is 32.48 kg/m.   Wt Readings from Last 3 Encounters:  09/13/19 195 lb 3.2 oz (88.5 kg)  09/09/19 189 lb (85.7 kg)  08/23/19 192 lb 9.6 oz (87.4 kg)      ECOG FS:0 - Asymptomatic  LAB RESULTS:  CMP     Component Value Date/Time   NA 141 08/23/2019 1159   K 4.0 08/23/2019 1159   CL 105 08/23/2019 1159   CO2 29 08/23/2019 1159   GLUCOSE 95 08/23/2019 1159   BUN 14 08/23/2019 1159   CREATININE 1.09 (H) 08/23/2019 1159   CALCIUM 10.6 (H) 08/23/2019 1159   PROT 7.2 08/23/2019 1159   ALBUMIN 4.3 08/23/2019 1159   AST 30 08/23/2019 1159   ALT 30 08/23/2019 1159   ALKPHOS 98 08/23/2019 1159   BILITOT 1.7 (H) 08/23/2019 1159   GFRNONAA 54 (L) 08/23/2019 1159   GFRAA >60 08/23/2019 1159    No results found for: TOTALPROTELP, ALBUMINELP, A1GS, A2GS, BETS, BETA2SER, GAMS, MSPIKE, SPEI  No results found for: Nils Pyle, [facility]  Lab Results  Component Value Date   WBC 8.3 09/13/2019   NEUTROABS 6.2 09/13/2019   HGB 13.8 09/13/2019   HCT 42.9 09/13/2019   MCV 95.5 09/13/2019   PLT 578 (H) 09/13/2019      Chemistry      Component Value Date/Time   NA 141 08/23/2019 1159   K 4.0 08/23/2019 1159   CL 105 08/23/2019 1159   CO2 29 08/23/2019 1159   BUN 14 08/23/2019 1159   CREATININE 1.09 (H) 08/23/2019 1159      Component Value Date/Time   CALCIUM 10.6 (H) 08/23/2019 1159   ALKPHOS 98 08/23/2019 1159   AST 30 08/23/2019 1159   ALT 30 08/23/2019 1159   BILITOT 1.7 (H) 08/23/2019 1159       No results found for: LABCA2  No components found for: [id number]  Recent Labs  Lab 09/09/19 0800  INR 1.1    No results found for: LABCA2  No results found for: ZSM270  No results  found for: BEM754  No results found for: GBE010  No results found for: CA2729  No components found for: HGQUANT  No results found for: CEA1 / No results found for: CEA1   No results found for: AFPTUMOR  No results found for: CHROMOGRNA  No results found for: HGBA, HGBA2QUANT, HGBFQUANT, HGBSQUAN (Hemoglobinopathy evaluation)   No results  found for: LDH  Lab Results  Component Value Date   IRON 94 08/23/2019   TIBC 313 08/23/2019   IRONPCTSAT 30 08/23/2019   (Iron and TIBC)  Lab Results  Component Value Date   FERRITIN 162 08/23/2019    Urinalysis No results found for: COLORURINE, APPEARANCEUR, LABSPEC, PHURINE, GLUCOSEU, HGBUR, BILIRUBINUR, KETONESUR, PROTEINUR, UROBILINOGEN, NITRITE, LEUKOCYTESUR   STUDIES: CT Biopsy  Result Date: Sep 13, 2019 INDICATION: Essential thrombocytosis. Please perform CT-guided bone marrow biopsy for tissue diagnostic purposes. EXAM: CT-GUIDED BONE MARROW BIOPSY AND ASPIRATION MEDICATIONS: None ANESTHESIA/SEDATION: Fentanyl 100 mcg IV; Versed 3 mg IV Sedation Time: 10 Minutes; The patient was continuously monitored during the procedure by the interventional radiology nurse under my direct supervision. COMPLICATIONS: None immediate. PROCEDURE: Informed consent was obtained from the patient following an explanation of the procedure, risks, benefits and alternatives. The patient understands, agrees and consents for the procedure. All questions were addressed. A time out was performed prior to the initiation of the procedure. The patient was positioned prone and non-contrast localization CT was performed of the pelvis to demonstrate the iliac marrow spaces. The operative site was prepped and draped in the usual sterile fashion. Under sterile conditions and local anesthesia, a 22 gauge spinal needle was utilized for procedural planning. Next, an 11 gauge coaxial bone biopsy needle was advanced into the left iliac marrow space. Needle position was confirmed  with CT imaging. Initially, a bone marrow aspiration was performed. Next, a bone marrow biopsy was obtained with the 11 gauge outer bone marrow device. Samples were prepared with the cytotechnologist and deemed adequate. The needle was removed and superficial hemostasis was obtained with manual compression. A dressing was applied. The patient tolerated the procedure well without immediate post procedural complication. IMPRESSION: Successful CT guided left iliac bone marrow aspiration and core biopsy. Electronically Signed   By: Sandi Mariscal M.D.   On: 09-13-19 10:05   CT BONE MARROW BIOPSY & ASPIRATION  Result Date: 13-Sep-2019 INDICATION: Essential thrombocytosis. Please perform CT-guided bone marrow biopsy for tissue diagnostic purposes. EXAM: CT-GUIDED BONE MARROW BIOPSY AND ASPIRATION MEDICATIONS: None ANESTHESIA/SEDATION: Fentanyl 100 mcg IV; Versed 3 mg IV Sedation Time: 10 Minutes; The patient was continuously monitored during the procedure by the interventional radiology nurse under my direct supervision. COMPLICATIONS: None immediate. PROCEDURE: Informed consent was obtained from the patient following an explanation of the procedure, risks, benefits and alternatives. The patient understands, agrees and consents for the procedure. All questions were addressed. A time out was performed prior to the initiation of the procedure. The patient was positioned prone and non-contrast localization CT was performed of the pelvis to demonstrate the iliac marrow spaces. The operative site was prepped and draped in the usual sterile fashion. Under sterile conditions and local anesthesia, a 22 gauge spinal needle was utilized for procedural planning. Next, an 11 gauge coaxial bone biopsy needle was advanced into the left iliac marrow space. Needle position was confirmed with CT imaging. Initially, a bone marrow aspiration was performed. Next, a bone marrow biopsy was obtained with the 11 gauge outer bone marrow device.  Samples were prepared with the cytotechnologist and deemed adequate. The needle was removed and superficial hemostasis was obtained with manual compression. A dressing was applied. The patient tolerated the procedure well without immediate post procedural complication. IMPRESSION: Successful CT guided left iliac bone marrow aspiration and core biopsy. Electronically Signed   By: Sandi Mariscal M.D.   On: 09-13-2019 10:05     ASSESSMENT: 64 y.o. [place] woman  who is here today for evaluation of increasing thrombocytosis.    (1) essential thrombocytosis  (a) platelet count 682 on 07/12/2019, repeat 08/23/2019 was 890  (b) screening studies showed normal red cell and white cells number and morphology, normal iron studies, normal C-reactive protein and sedimentation rate and negative ANA  (c) bone marrow biopsy 09/09/2019 shows an increase in atypical megakaryocytes, no white cell dysplasia or increase in blasts, no increase in reticulin or evidence of fibrosis, present iron stores  (d) genomics 09/02/2019 shows a mutation in JAK2 p.Val617Phe; no mutations in JAK2 exon 12, MPL or CALR  (2) Hydrea started 09/13/2019 at 500 mg daily   PLAN: I gave Geana a copy of her lab work, bone marrow biopsy report, and genomics report and we spent some time going over it so she would understand it.  She knows that essential thrombocytosis means the rise in platelets is not due to something extraneous such as for example iron deficiency.  It is intrinsic or essential to the platelets themselves and it is due to a mutation in one of the platelet precursors.  That mutation is called JAK2 and it is not something she was born with, inherited, or can pass on to her children.  The mutation is due to an error in one of the platelet precursors called megakaryocytes and this does lead to uncontrolled platelet proliferation, basically too many platelets.  Concerns regarding essential thrombocytosis include both clotting and  bleeding.  The clotting may occur because there are too many platelets, which are clotting cells.  The bleeding may occur because the platelets may not be functionally normal.  In addition if allowed to run uncontrolled essential thrombocytosis can cause fibrosis of the bone marrow.  This means the bone marrow is scarred up, cannot make normal cells, and at that time the rare possibility of leukemic transformation can occur.  It is important to note that development of this process may take many decades and that if the platelet count is controlled as we are going to do that problem can be prevented.  Accordingly our goal is to bring the platelet count down to a normal range.  We are going to do this with hydroxyurea.  We discussed the possible toxicities side effects and complications of this agent which is generally quite well tolerated at low doses.  She will take 1 tablet a day, 500 mg and we will recheck her counts in a month.  If everything is where we needed to be at that time then she will be followed here with lab work every 3 months and visits every year indefinitely.  Juliet has a good understanding of this plan.  She agrees with it.  She knows to call us if there are cost issues related to the drug or any other concerns regarding the medication  Total encounter time 45 minutes.Sarajane Jews C. Yamile Roedl, MD Medical Oncology and Hematology [facility] [place], [place] [address] Tel. [phone]    Fax. [phone]   I, Wilburn Mylar, am acting as scribe for Dr. Virgie Dad. Saryna Kneeland.  I, Lurline Del MD, have reviewed the above documentation for accuracy and completeness, and I agree with the above.    *Total Encounter Time as defined by the Centers for Medicare and Medicaid Services includes, in addition to the face-to-face time of a patient visit (documented in the note above) non-face-to-face time: obtaining and reviewing outside history, ordering and  reviewing medications, tests or procedures,  care coordination (communications with other health care professionals or caregivers) and documentation in the medical record.

## 2019-09-13 ENCOUNTER — Other Ambulatory Visit: Payer: Self-pay | Admitting: Oncology

## 2019-09-13 ENCOUNTER — Other Ambulatory Visit: Payer: Self-pay | Admitting: *Deleted

## 2019-09-13 ENCOUNTER — Other Ambulatory Visit: Payer: Self-pay

## 2019-09-13 ENCOUNTER — Inpatient Hospital Stay: Payer: BC Managed Care – PPO | Attending: Oncology | Admitting: Oncology

## 2019-09-13 ENCOUNTER — Inpatient Hospital Stay: Payer: BC Managed Care – PPO

## 2019-09-13 VITALS — BP 159/77 | HR 88 | Temp 98.5°F | Resp 20 | Ht 65.0 in | Wt 195.2 lb

## 2019-09-13 DIAGNOSIS — Z87891 Personal history of nicotine dependence: Secondary | ICD-10-CM | POA: Insufficient documentation

## 2019-09-13 DIAGNOSIS — Z7952 Long term (current) use of systemic steroids: Secondary | ICD-10-CM | POA: Diagnosis not present

## 2019-09-13 DIAGNOSIS — D75839 Thrombocytosis, unspecified: Secondary | ICD-10-CM

## 2019-09-13 DIAGNOSIS — D473 Essential (hemorrhagic) thrombocythemia: Secondary | ICD-10-CM

## 2019-09-13 DIAGNOSIS — Z791 Long term (current) use of non-steroidal anti-inflammatories (NSAID): Secondary | ICD-10-CM | POA: Insufficient documentation

## 2019-09-13 DIAGNOSIS — Z79899 Other long term (current) drug therapy: Secondary | ICD-10-CM | POA: Diagnosis not present

## 2019-09-13 LAB — CBC WITH DIFFERENTIAL (CANCER CENTER ONLY)
Abs Immature Granulocytes: 0.02 10*3/uL (ref 0.00–0.07)
Basophils Absolute: 0.1 10*3/uL (ref 0.0–0.1)
Basophils Relative: 1 %
Eosinophils Absolute: 0.1 10*3/uL (ref 0.0–0.5)
Eosinophils Relative: 2 %
HCT: 42.9 % (ref 36.0–46.0)
Hemoglobin: 13.8 g/dL (ref 12.0–15.0)
Immature Granulocytes: 0 %
Lymphocytes Relative: 17 %
Lymphs Abs: 1.4 10*3/uL (ref 0.7–4.0)
MCH: 30.7 pg (ref 26.0–34.0)
MCHC: 32.2 g/dL (ref 30.0–36.0)
MCV: 95.5 fL (ref 80.0–100.0)
Monocytes Absolute: 0.5 10*3/uL (ref 0.1–1.0)
Monocytes Relative: 6 %
Neutro Abs: 6.2 10*3/uL (ref 1.7–7.7)
Neutrophils Relative %: 74 %
Platelet Count: 578 10*3/uL — ABNORMAL HIGH (ref 150–400)
RBC: 4.49 MIL/uL (ref 3.87–5.11)
RDW: 12.2 % (ref 11.5–15.5)
WBC Count: 8.3 10*3/uL (ref 4.0–10.5)
nRBC: 0 % (ref 0.0–0.2)

## 2019-09-13 LAB — SAVE SMEAR(SSMR), FOR PROVIDER SLIDE REVIEW

## 2019-09-13 LAB — SURGICAL PATHOLOGY

## 2019-09-13 MED ORDER — HYDROXYUREA 500 MG PO CAPS
500.0000 mg | ORAL_CAPSULE | Freq: Every day | ORAL | 4 refills | Status: DC
Start: 2019-09-13 — End: 2019-10-18

## 2019-09-15 ENCOUNTER — Telehealth: Payer: Self-pay | Admitting: Oncology

## 2019-09-15 NOTE — Telephone Encounter (Signed)
Scheduled appts per 5/3 los. Pt confirmed appt date and time.

## 2019-09-20 ENCOUNTER — Encounter (HOSPITAL_COMMUNITY): Payer: Self-pay | Admitting: Adult Health

## 2019-10-17 NOTE — Progress Notes (Signed)
[place]  [phone] Fax:(336) [phone]     ID: Lisa Buckley [facility] DOB: 12-29-1955  MR#: [id number]  YBW#:[id number]  Patient Care Team: Donald Prose, MD as PCP - General (Family Medicine) Chauncey Cruel, MD OTHER MD:  CHIEF COMPLAINT: essential thrombocytosis  CURRENT TREATMENT:  hydroxyurea   INTERVAL HISTORY: Jasani returns today for follow up of her JAK2 positive essential thrombocytosis.  She started on hydroxyurea at her last visit on 09/13/2019.  She paid approximately $15 a month for this.  She has had no nausea rash or other side effects from the medication.  She understands the need to wash your hands thoroughly and preferably use gloves when taking it   REVIEW OF SYSTEMS: Kathy's review of systems today was otherwise entirely stable   HISTORY OF CURRENT ILLNESS: From the original intake note:  Leaner has been seeing her PCP annually with labs.  Per Elisheva's report, she notes that in 2020 when she went to see Dr. Nancy Fetter, her platelet count was mildly elevated.  The following year, in 07/2019 when she went for her next physical and had her labs drawn again her platelet count was elevated again to 682.  At that point, Aamina was referred to see hematology, as her platelet count continued to rise.  There has been no history of infection, iron deficiency, bleeding, or any other lab abnormality.    Her CBC on 08/02/2019 is as follows:  WBC     5.8 RBC     4.52 HGB     14.2 HCT     41.8 MCV     92.5 MCH     31.4 MCHC  34.0 RDW   13.2 PLT      682  The patient's subsequent history is as detailed below.    PAST MEDICAL HISTORY: Past Medical History:  Diagnosis Date  . HTN (hypertension)   . Seasonal allergies     PAST SURGICAL HISTORY: Past Surgical History:  Procedure Laterality Date  . ABDOMINAL HYSTERECTOMY  1992    FAMILY HISTORY Family History  Problem Relation Age of Onset  . Hypertension Mother   . Hypertension Father   . Colon cancer  Sister   . Colon cancer Brother     GYNECOLOGIC HISTORY:  Menarche: 64 years old Age at first live birth: 64 years old [place] P 1 LMP 1992 Contraceptive no HRT no  Hysterectomy? 1992 Salpingo-oophorectomy? 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  Their son Legrand Como runs a [employer] in [place].  The patient has 3 grandchildren aged 61, 35 and 101 as of April 2021.     ADVANCED DIRECTIVES: In the absence of any documents to the contrary the patient's husband is her healthcare power of attorney   HEALTH MAINTENANCE: Social History   Tobacco Use  . Smoking status: Former Research scientist (life sciences)  . Smokeless tobacco: Never Used  Substance Use Topics  . Alcohol use: Yes    Comment: rarely  . Drug use: Not on file     Colonoscopy: 2018 (every 5 years)  PAP: 2018 (has regular pelvic exams)  Mammo: 08/20/2019   Allergies  Allergen Reactions  . Morphine And Related     Current Outpatient Medications  Medication Sig Dispense Refill  . cetirizine (ZYRTEC) 10 MG tablet Take 10 mg by mouth daily.    Marland Kitchen  esomeprazole (NEXIUM) 40 MG capsule Take 40 mg by mouth daily at 12 noon.    . hydroxyurea (HYDREA) 500 MG capsule Take 2 capsules (1,000 mg total) by mouth daily. May take with food to minimize GI side effects. 180 capsule 4  . naproxen sodium (ALEVE) 220 MG tablet Take 220 mg by mouth.    . telmisartan-hydrochlorothiazide (MICARDIS HCT) 80-25 MG tablet TK 1 T PO QD  1   No current facility-administered medications for this visit.    OBJECTIVE: African-American woman who appears well  Vitals:   10/18/19 1116  BP: (!) 149/68  Pulse: 76  Resp: 18  Temp: 98.3 F (36.8 C)  SpO2: 100%     Body mass index is 31.87 kg/m.   Wt Readings from Last 3 Encounters:  10/18/19 191 lb 8 oz (86.9 kg)  09/13/19 195 lb 3.2 oz (88.5 kg)  09/09/19 189  lb (85.7 kg)      ECOG FS:0 - Asymptomatic   Sclerae unicteric, EOMs intact Wearing a mask No cervical or supraclavicular adenopathy Lungs no rales or rhonchi Heart regular rate and rhythm Abd soft, nontender, positive bowel sounds Neuro: nonfocal, well oriented, appropriate affect   LAB RESULTS:  CMP     Component Value Date/Time   NA 141 08/23/2019 1159   K 4.0 08/23/2019 1159   CL 105 08/23/2019 1159   CO2 29 08/23/2019 1159   GLUCOSE 95 08/23/2019 1159   BUN 14 08/23/2019 1159   CREATININE 1.09 (H) 08/23/2019 1159   CALCIUM 10.6 (H) 08/23/2019 1159   PROT 7.2 08/23/2019 1159   ALBUMIN 4.3 08/23/2019 1159   AST 30 08/23/2019 1159   ALT 30 08/23/2019 1159   ALKPHOS 98 08/23/2019 1159   BILITOT 1.7 (H) 08/23/2019 1159   GFRNONAA 54 (L) 08/23/2019 1159   GFRAA >60 08/23/2019 1159    No results found for: TOTALPROTELP, ALBUMINELP, A1GS, A2GS, BETS, BETA2SER, GAMS, MSPIKE, SPEI  No results found for: KPAFRELGTCHN, LAMBDASER, KAPLAMBRATIO  Lab Results  Component Value Date   WBC 6.2 10/18/2019   NEUTROABS 3.8 10/18/2019   HGB 14.5 10/18/2019   HCT 44.9 10/18/2019   MCV 95.7 10/18/2019   PLT 677 (H) 10/18/2019      Chemistry      Component Value Date/Time   NA 141 08/23/2019 1159   K 4.0 08/23/2019 1159   CL 105 08/23/2019 1159   CO2 29 08/23/2019 1159   BUN 14 08/23/2019 1159   CREATININE 1.09 (H) 08/23/2019 1159      Component Value Date/Time   CALCIUM 10.6 (H) 08/23/2019 1159   ALKPHOS 98 08/23/2019 1159   AST 30 08/23/2019 1159   ALT 30 08/23/2019 1159   BILITOT 1.7 (H) 08/23/2019 1159       No results found for: LABCA2  No components found for: VCBSWH675  No results for input(s): INR in the last 168 hours.  No results found for: LABCA2  No results found for: FFM384  No results found for: YKZ993  No results found for: TTS177  No results found for: CA2729  No components found for: HGQUANT  No results found for: CEA1 / No results  found for: CEA1   No results found for: AFPTUMOR  No results found for: CHROMOGRNA  No results found for: HGBA, HGBA2QUANT, HGBFQUANT, HGBSQUAN (Hemoglobinopathy evaluation)   No results found for: LDH  Lab Results  Component Value Date   IRON 94 08/23/2019   TIBC 313 08/23/2019   IRONPCTSAT 30 08/23/2019   (  Iron and TIBC)  Lab Results  Component Value Date   FERRITIN 162 08/23/2019    Urinalysis No results found for: COLORURINE, APPEARANCEUR, LABSPEC, PHURINE, GLUCOSEU, HGBUR, BILIRUBINUR, KETONESUR, PROTEINUR, UROBILINOGEN, NITRITE, LEUKOCYTESUR   STUDIES: No results found.   ASSESSMENT: 64 y.o. [place] woman who is here today for evaluation of increasing thrombocytosis.    (1) essential thrombocytosis  (a) platelet count 682 on 07/12/2019, repeat 08/23/2019 was 890  (b) screening studies showed normal red cell and white cells number and morphology, normal iron studies, normal C-reactive protein and sedimentation rate and negative ANA  (c) bone marrow biopsy 09/09/2019 shows an increase in atypical megakaryocytes, no white cell dysplasia or increase in blasts, no increase in reticulin or evidence of fibrosis, present iron stores  (d) genomics 09/02/2019 shows a mutation in JAK2 p.Val617Phe; no mutations in JAK2 exon 12, MPL or CALR  (2) Hydrea started 09/13/2019 at 500 mg daily  (a) dose increased to 1000 mg daily as of 10/19/2019   PLAN: Nazareno is tolerating Hydrea well.  We have not found her therapeutic dose yet but since she is doing so well with the medication I do not think she needs to see me every time we check labs.  We can always do virtual visits as needed.  Of course I will see her on a once a year basis indefinitely  Accordingly we are increasing the Hydrea dose to 1 g daily beginning tomorrow 10/19/2019.  We will check lab work in a month then again in September December and March.  She will return to see me in June of next year.  Of course I will be glad  to see her at any point if necessary and she understands it is very easy to schedule a virtual visit as needed   Total encounter time 20 minutes.Sarajane Jews C. Johneisha Broaden, MD Medical Oncology and Hematology [facility] [place], [place] [address] Tel. [phone]    Fax. [phone]   I, Wilburn Mylar, am acting as scribe for Dr. Virgie Dad. Kora Groom.  I, Lurline Del MD, have reviewed the above documentation for accuracy and completeness, and I agree with the above.   *Total Encounter Time as defined by the Centers for Medicare and Medicaid Services includes, in addition to the face-to-face time of a patient visit (documented in the note above) non-face-to-face time: obtaining and reviewing outside history, ordering and reviewing medications, tests or procedures, care coordination (communications with other health care professionals or caregivers) and documentation in the medical record.

## 2019-10-18 ENCOUNTER — Other Ambulatory Visit: Payer: Self-pay

## 2019-10-18 ENCOUNTER — Inpatient Hospital Stay: Payer: BC Managed Care – PPO | Attending: Oncology | Admitting: Oncology

## 2019-10-18 ENCOUNTER — Telehealth: Payer: Self-pay | Admitting: Oncology

## 2019-10-18 ENCOUNTER — Inpatient Hospital Stay: Payer: BC Managed Care – PPO

## 2019-10-18 VITALS — BP 149/68 | HR 76 | Temp 98.3°F | Resp 18 | Ht 65.0 in | Wt 191.5 lb

## 2019-10-18 DIAGNOSIS — Z87891 Personal history of nicotine dependence: Secondary | ICD-10-CM | POA: Diagnosis not present

## 2019-10-18 DIAGNOSIS — D473 Essential (hemorrhagic) thrombocythemia: Secondary | ICD-10-CM | POA: Diagnosis present

## 2019-10-18 DIAGNOSIS — Z79899 Other long term (current) drug therapy: Secondary | ICD-10-CM | POA: Diagnosis not present

## 2019-10-18 DIAGNOSIS — I10 Essential (primary) hypertension: Secondary | ICD-10-CM | POA: Diagnosis not present

## 2019-10-18 LAB — CBC WITH DIFFERENTIAL/PLATELET
Abs Immature Granulocytes: 0.01 10*3/uL (ref 0.00–0.07)
Basophils Absolute: 0.1 10*3/uL (ref 0.0–0.1)
Basophils Relative: 1 %
Eosinophils Absolute: 0.2 10*3/uL (ref 0.0–0.5)
Eosinophils Relative: 2 %
HCT: 44.9 % (ref 36.0–46.0)
Hemoglobin: 14.5 g/dL (ref 12.0–15.0)
Immature Granulocytes: 0 %
Lymphocytes Relative: 28 %
Lymphs Abs: 1.7 10*3/uL (ref 0.7–4.0)
MCH: 30.9 pg (ref 26.0–34.0)
MCHC: 32.3 g/dL (ref 30.0–36.0)
MCV: 95.7 fL (ref 80.0–100.0)
Monocytes Absolute: 0.5 10*3/uL (ref 0.1–1.0)
Monocytes Relative: 8 %
Neutro Abs: 3.8 10*3/uL (ref 1.7–7.7)
Neutrophils Relative %: 61 %
Platelets: 677 10*3/uL — ABNORMAL HIGH (ref 150–400)
RBC: 4.69 MIL/uL (ref 3.87–5.11)
RDW: 13.1 % (ref 11.5–15.5)
WBC: 6.2 10*3/uL (ref 4.0–10.5)
nRBC: 0 % (ref 0.0–0.2)

## 2019-10-18 MED ORDER — HYDROXYUREA 500 MG PO CAPS
1000.0000 mg | ORAL_CAPSULE | Freq: Every day | ORAL | 4 refills | Status: DC
Start: 2019-10-18 — End: 2020-11-27

## 2019-10-18 NOTE — Telephone Encounter (Signed)
Scheduled appts per 6/7 los. Pt declined print out of AVS. Gave pt a print out of appt calendar.  

## 2019-10-21 LAB — JAK2 (INCLUDING V617F AND EXON 12), MPL,& CALR-NEXT GEN SEQ

## 2019-11-16 ENCOUNTER — Inpatient Hospital Stay: Payer: Medicare PPO | Attending: Adult Health

## 2019-11-16 ENCOUNTER — Other Ambulatory Visit: Payer: Self-pay

## 2019-11-16 DIAGNOSIS — D473 Essential (hemorrhagic) thrombocythemia: Secondary | ICD-10-CM

## 2019-11-16 LAB — CBC WITH DIFFERENTIAL/PLATELET
Abs Immature Granulocytes: 0.01 10*3/uL (ref 0.00–0.07)
Basophils Absolute: 0 10*3/uL (ref 0.0–0.1)
Basophils Relative: 1 %
Eosinophils Absolute: 0.1 10*3/uL (ref 0.0–0.5)
Eosinophils Relative: 2 %
HCT: 43.3 % (ref 36.0–46.0)
Hemoglobin: 14.2 g/dL (ref 12.0–15.0)
Immature Granulocytes: 0 %
Lymphocytes Relative: 33 %
Lymphs Abs: 1.6 10*3/uL (ref 0.7–4.0)
MCH: 31.7 pg (ref 26.0–34.0)
MCHC: 32.8 g/dL (ref 30.0–36.0)
MCV: 96.7 fL (ref 80.0–100.0)
Monocytes Absolute: 0.4 10*3/uL (ref 0.1–1.0)
Monocytes Relative: 7 %
Neutro Abs: 2.8 10*3/uL (ref 1.7–7.7)
Neutrophils Relative %: 57 %
Platelets: 415 10*3/uL — ABNORMAL HIGH (ref 150–400)
RBC: 4.48 MIL/uL (ref 3.87–5.11)
RDW: 13.3 % (ref 11.5–15.5)
WBC: 4.9 10*3/uL (ref 4.0–10.5)
nRBC: 0 % (ref 0.0–0.2)

## 2020-01-18 ENCOUNTER — Other Ambulatory Visit: Payer: Self-pay

## 2020-01-18 ENCOUNTER — Inpatient Hospital Stay: Payer: Medicare PPO | Attending: Adult Health

## 2020-01-18 DIAGNOSIS — D473 Essential (hemorrhagic) thrombocythemia: Secondary | ICD-10-CM

## 2020-01-18 LAB — CBC WITH DIFFERENTIAL/PLATELET
Abs Immature Granulocytes: 0.01 10*3/uL (ref 0.00–0.07)
Basophils Absolute: 0 10*3/uL (ref 0.0–0.1)
Basophils Relative: 1 %
Eosinophils Absolute: 0.1 10*3/uL (ref 0.0–0.5)
Eosinophils Relative: 2 %
HCT: 34.9 % — ABNORMAL LOW (ref 36.0–46.0)
Hemoglobin: 11.8 g/dL — ABNORMAL LOW (ref 12.0–15.0)
Immature Granulocytes: 0 %
Lymphocytes Relative: 38 %
Lymphs Abs: 1.8 10*3/uL (ref 0.7–4.0)
MCH: 35.1 pg — ABNORMAL HIGH (ref 26.0–34.0)
MCHC: 33.8 g/dL (ref 30.0–36.0)
MCV: 103.9 fL — ABNORMAL HIGH (ref 80.0–100.0)
Monocytes Absolute: 0.4 10*3/uL (ref 0.1–1.0)
Monocytes Relative: 8 %
Neutro Abs: 2.5 10*3/uL (ref 1.7–7.7)
Neutrophils Relative %: 51 %
Platelets: 342 10*3/uL (ref 150–400)
RBC: 3.36 MIL/uL — ABNORMAL LOW (ref 3.87–5.11)
RDW: 13.9 % (ref 11.5–15.5)
WBC: 4.8 10*3/uL (ref 4.0–10.5)
nRBC: 0 % (ref 0.0–0.2)

## 2020-04-18 ENCOUNTER — Inpatient Hospital Stay: Payer: Medicare PPO | Attending: Adult Health

## 2020-04-18 ENCOUNTER — Other Ambulatory Visit: Payer: Self-pay

## 2020-04-18 DIAGNOSIS — D473 Essential (hemorrhagic) thrombocythemia: Secondary | ICD-10-CM | POA: Diagnosis not present

## 2020-04-18 LAB — CBC WITH DIFFERENTIAL/PLATELET
Abs Immature Granulocytes: 0.01 10*3/uL (ref 0.00–0.07)
Basophils Absolute: 0 10*3/uL (ref 0.0–0.1)
Basophils Relative: 1 %
Eosinophils Absolute: 0.1 10*3/uL (ref 0.0–0.5)
Eosinophils Relative: 1 %
HCT: 37.8 % (ref 36.0–46.0)
Hemoglobin: 12.7 g/dL (ref 12.0–15.0)
Immature Granulocytes: 0 %
Lymphocytes Relative: 34 %
Lymphs Abs: 1.7 10*3/uL (ref 0.7–4.0)
MCH: 35.9 pg — ABNORMAL HIGH (ref 26.0–34.0)
MCHC: 33.6 g/dL (ref 30.0–36.0)
MCV: 106.8 fL — ABNORMAL HIGH (ref 80.0–100.0)
Monocytes Absolute: 0.3 10*3/uL (ref 0.1–1.0)
Monocytes Relative: 6 %
Neutro Abs: 2.9 10*3/uL (ref 1.7–7.7)
Neutrophils Relative %: 58 %
Platelets: 340 10*3/uL (ref 150–400)
RBC: 3.54 MIL/uL — ABNORMAL LOW (ref 3.87–5.11)
RDW: 12.3 % (ref 11.5–15.5)
WBC: 4.9 10*3/uL (ref 4.0–10.5)
nRBC: 0 % (ref 0.0–0.2)

## 2020-04-18 LAB — COMPREHENSIVE METABOLIC PANEL
ALT: 17 U/L (ref 0–44)
AST: 17 U/L (ref 15–41)
Albumin: 3.9 g/dL (ref 3.5–5.0)
Alkaline Phosphatase: 96 U/L (ref 38–126)
Anion gap: 8 (ref 5–15)
BUN: 13 mg/dL (ref 8–23)
CO2: 29 mmol/L (ref 22–32)
Calcium: 10.6 mg/dL — ABNORMAL HIGH (ref 8.9–10.3)
Chloride: 105 mmol/L (ref 98–111)
Creatinine, Ser: 1 mg/dL (ref 0.44–1.00)
GFR, Estimated: >60 mL/min (ref >60)
Glucose, Bld: 102 mg/dL — ABNORMAL HIGH (ref 70–99)
Potassium: 4 mmol/L (ref 3.5–5.1)
Sodium: 142 mmol/L (ref 135–145)
Total Bilirubin: 1.8 mg/dL — ABNORMAL HIGH (ref 0.3–1.2)
Total Protein: 6.8 g/dL (ref 6.5–8.1)

## 2020-05-01 DIAGNOSIS — H269 Unspecified cataract: Secondary | ICD-10-CM | POA: Diagnosis not present

## 2020-06-13 DIAGNOSIS — Z20828 Contact with and (suspected) exposure to other viral communicable diseases: Secondary | ICD-10-CM | POA: Diagnosis not present

## 2020-07-03 DIAGNOSIS — Z20828 Contact with and (suspected) exposure to other viral communicable diseases: Secondary | ICD-10-CM | POA: Diagnosis not present

## 2020-07-17 ENCOUNTER — Inpatient Hospital Stay: Payer: Medicare PPO | Attending: Adult Health

## 2020-07-17 ENCOUNTER — Other Ambulatory Visit: Payer: Self-pay

## 2020-07-17 DIAGNOSIS — D473 Essential (hemorrhagic) thrombocythemia: Secondary | ICD-10-CM | POA: Diagnosis not present

## 2020-07-17 LAB — CBC WITH DIFFERENTIAL/PLATELET
Abs Immature Granulocytes: 0.01 10*3/uL (ref 0.00–0.07)
Basophils Absolute: 0 10*3/uL (ref 0.0–0.1)
Basophils Relative: 0 %
Eosinophils Absolute: 0.1 10*3/uL (ref 0.0–0.5)
Eosinophils Relative: 2 %
HCT: 36.6 % (ref 36.0–46.0)
Hemoglobin: 12.6 g/dL (ref 12.0–15.0)
Immature Granulocytes: 0 %
Lymphocytes Relative: 34 %
Lymphs Abs: 1.5 10*3/uL (ref 0.7–4.0)
MCH: 36.1 pg — ABNORMAL HIGH (ref 26.0–34.0)
MCHC: 34.4 g/dL (ref 30.0–36.0)
MCV: 104.9 fL — ABNORMAL HIGH (ref 80.0–100.0)
Monocytes Absolute: 0.3 10*3/uL (ref 0.1–1.0)
Monocytes Relative: 6 %
Neutro Abs: 2.6 10*3/uL (ref 1.7–7.7)
Neutrophils Relative %: 58 %
Platelets: 338 10*3/uL (ref 150–400)
RBC: 3.49 MIL/uL — ABNORMAL LOW (ref 3.87–5.11)
RDW: 11.9 % (ref 11.5–15.5)
WBC: 4.4 10*3/uL (ref 4.0–10.5)
nRBC: 0 % (ref 0.0–0.2)

## 2020-08-09 DIAGNOSIS — Z23 Encounter for immunization: Secondary | ICD-10-CM | POA: Diagnosis not present

## 2020-08-09 DIAGNOSIS — D473 Essential (hemorrhagic) thrombocythemia: Secondary | ICD-10-CM | POA: Diagnosis not present

## 2020-08-09 DIAGNOSIS — Z1389 Encounter for screening for other disorder: Secondary | ICD-10-CM | POA: Diagnosis not present

## 2020-08-09 DIAGNOSIS — I10 Essential (primary) hypertension: Secondary | ICD-10-CM | POA: Diagnosis not present

## 2020-08-09 DIAGNOSIS — Z1159 Encounter for screening for other viral diseases: Secondary | ICD-10-CM | POA: Diagnosis not present

## 2020-08-09 DIAGNOSIS — Z0000 Encounter for general adult medical examination without abnormal findings: Secondary | ICD-10-CM | POA: Diagnosis not present

## 2020-09-03 ENCOUNTER — Other Ambulatory Visit: Payer: Self-pay | Admitting: Oncology

## 2020-09-06 ENCOUNTER — Telehealth: Payer: Self-pay | Admitting: Oncology

## 2020-09-06 NOTE — Telephone Encounter (Signed)
R/s appts per 4/24 sch msg. Called pt, no answer. Left msg with appts dates and times.

## 2020-10-17 ENCOUNTER — Ambulatory Visit: Payer: BC Managed Care – PPO | Admitting: Oncology

## 2020-10-17 ENCOUNTER — Inpatient Hospital Stay: Payer: Medicare PPO | Attending: Adult Health

## 2020-10-17 ENCOUNTER — Other Ambulatory Visit: Payer: Self-pay

## 2020-10-17 DIAGNOSIS — D473 Essential (hemorrhagic) thrombocythemia: Secondary | ICD-10-CM | POA: Insufficient documentation

## 2020-10-17 LAB — CBC WITH DIFFERENTIAL/PLATELET
Abs Immature Granulocytes: 0.01 10*3/uL (ref 0.00–0.07)
Basophils Absolute: 0 10*3/uL (ref 0.0–0.1)
Basophils Relative: 1 %
Eosinophils Absolute: 0 10*3/uL (ref 0.0–0.5)
Eosinophils Relative: 1 %
HCT: 40.2 % (ref 36.0–46.0)
Hemoglobin: 13.7 g/dL (ref 12.0–15.0)
Immature Granulocytes: 0 %
Lymphocytes Relative: 28 %
Lymphs Abs: 1.2 10*3/uL (ref 0.7–4.0)
MCH: 35.3 pg — ABNORMAL HIGH (ref 26.0–34.0)
MCHC: 34.1 g/dL (ref 30.0–36.0)
MCV: 103.6 fL — ABNORMAL HIGH (ref 80.0–100.0)
Monocytes Absolute: 0.3 10*3/uL (ref 0.1–1.0)
Monocytes Relative: 6 %
Neutro Abs: 2.8 10*3/uL (ref 1.7–7.7)
Neutrophils Relative %: 64 %
Platelets: 370 10*3/uL (ref 150–400)
RBC: 3.88 MIL/uL (ref 3.87–5.11)
RDW: 11.9 % (ref 11.5–15.5)
WBC: 4.3 10*3/uL (ref 4.0–10.5)
nRBC: 0 % (ref 0.0–0.2)

## 2020-10-24 DIAGNOSIS — Z124 Encounter for screening for malignant neoplasm of cervix: Secondary | ICD-10-CM | POA: Diagnosis not present

## 2020-10-24 DIAGNOSIS — Z90711 Acquired absence of uterus with remaining cervical stump: Secondary | ICD-10-CM | POA: Diagnosis not present

## 2020-10-24 DIAGNOSIS — Z6832 Body mass index (BMI) 32.0-32.9, adult: Secondary | ICD-10-CM | POA: Diagnosis not present

## 2020-10-24 DIAGNOSIS — Z800 Family history of malignant neoplasm of digestive organs: Secondary | ICD-10-CM | POA: Diagnosis not present

## 2020-10-24 DIAGNOSIS — Z01419 Encounter for gynecological examination (general) (routine) without abnormal findings: Secondary | ICD-10-CM | POA: Diagnosis not present

## 2020-10-24 DIAGNOSIS — Z1231 Encounter for screening mammogram for malignant neoplasm of breast: Secondary | ICD-10-CM | POA: Diagnosis not present

## 2020-10-24 DIAGNOSIS — Z01411 Encounter for gynecological examination (general) (routine) with abnormal findings: Secondary | ICD-10-CM | POA: Diagnosis not present

## 2020-10-25 DIAGNOSIS — M25561 Pain in right knee: Secondary | ICD-10-CM | POA: Diagnosis not present

## 2020-10-25 DIAGNOSIS — M1711 Unilateral primary osteoarthritis, right knee: Secondary | ICD-10-CM | POA: Diagnosis not present

## 2020-11-27 ENCOUNTER — Other Ambulatory Visit: Payer: Self-pay | Admitting: Oncology

## 2020-11-27 NOTE — Telephone Encounter (Signed)
Refilling x 1, so patient does not run out of prescription prior to her f/u with Dr. Jana Hakim.  This is a one time fill, as patient must be seen for evaluation and monitoring prior to further refills.  Wilber Bihari, NP

## 2021-01-16 DIAGNOSIS — Z1389 Encounter for screening for other disorder: Secondary | ICD-10-CM | POA: Diagnosis not present

## 2021-01-16 DIAGNOSIS — I10 Essential (primary) hypertension: Secondary | ICD-10-CM | POA: Diagnosis not present

## 2021-01-16 DIAGNOSIS — E6609 Other obesity due to excess calories: Secondary | ICD-10-CM | POA: Diagnosis not present

## 2021-01-22 NOTE — Progress Notes (Signed)
[place]  Telephone:([phone] Fax:(336) [phone]     ID: Noele Icenhour [facility] DOB: 06-26-1955  MR#: [id number]  VZD#:[id number]  Patient Care Team: Donald Prose, MD as PCP - General (Family Medicine) Chauncey Cruel, MD OTHER MD:  CHIEF COMPLAINT: essential thrombocytosis  CURRENT TREATMENT:  hydroxyurea   INTERVAL HISTORY: Naria returns today for follow up of her JAK2 positive essential thrombocytosis.  She started hydroxyurea on 09/13/2019.  She paid approximately $15 a month for this.  She has had no nausea rash or other side effects from the medication.  She understands the need to wash your hands thoroughly and preferably use gloves when taking it  Her platelet count has been well controlled. Results for SKYLEIGH, WINDLE (MRN [id number]) as of 01/23/2021 08:12  Ref. Range 11/16/2019 14:30 01/18/2020 14:29 04/18/2020 14:18 07/17/2020 14:22 10/17/2020 09:54  Platelets Latest Ref Range: 150 - 400 K/uL 415 (H) 342 340 338 370   White cell count and hemoglobin remain in the normal range.  She understands that the lower range of normal for white blood cells in African-Americans is 3.5.  We also reviewed the fact that the Hydrea increases the MCV and in her case that is not a sign of low B12 or folate.  REVIEW OF SYSTEMS: Leondra exercises daily 30 to 40 minutes on her bike.  She has a new job which she enjoys, from 2-6 during weekdays.  Family is doing fine.  She is up-to-date on mammography which she gets done with her gynecology visit.  A detailed review of systems today was otherwise stable.   COVID 19 VACCINATION STATUS: Status post Pfizer x4 as of September 2020   HISTORY OF CURRENT ILLNESS: From the original intake note:  Itzel has been seeing her PCP annually with labs.  Per Dessire's report, she notes that in 2020 when she went to see Dr. Nancy Fetter, her platelet count was mildly elevated.  The following year, in 07/2019 when she went for her next physical and had her labs drawn again  her platelet count was elevated again to 682.  At that point, Danetra was referred to see hematology, as her platelet count continued to rise.  There has been no history of infection, iron deficiency, bleeding, or any other lab abnormality.    Her CBC on 08/02/2019 is as follows:  WBC     5.8 RBC     4.52 HGB     14.2 HCT     41.8 MCV     92.5 MCH     31.4 MCHC  34.0 RDW   13.2 PLT      682  The patient's subsequent history is as detailed below.    PAST MEDICAL HISTORY: Past Medical History:  Diagnosis Date   HTN (hypertension)    Seasonal allergies     PAST SURGICAL HISTORY: Past Surgical History:  Procedure Laterality Date   ABDOMINAL HYSTERECTOMY  1992    FAMILY HISTORY Family History  Problem Relation Age of Onset   Hypertension Mother    Hypertension Father    Colon cancer Sister    Colon cancer Brother     GYNECOLOGIC HISTORY:  Menarche: 65 years old Age at first live birth: 65 years old [place] P 1 LMP 1992 Contraceptive no HRT no  Hysterectomy? 1992 Salpingo-oophorectomy? 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  Her son Legrand Como runs a [employer] in [place].  The patient has 3 grandchildren aged 75, 16 and 67 as of April 2021.     ADVANCED DIRECTIVES: In the absence of any documents to the contrary the patient's husband is her healthcare power of attorney   HEALTH MAINTENANCE: Social History   Tobacco Use   Smoking status: Former   Smokeless tobacco: Never  Substance Use Topics   Alcohol use: Yes    Comment: rarely     Colonoscopy: 2018 (every 5 years)  PAP: 2018 (has regular pelvic exams)  Mammo: 08/20/2019   Allergies  Allergen Reactions   Morphine And Related     Current Outpatient  Medications  Medication Sig Dispense Refill   cetirizine (ZYRTEC) 10 MG tablet Take 10 mg by mouth daily.     esomeprazole (NEXIUM) 40 MG capsule Take 40 mg by mouth daily at 12 noon.     hydroxyurea (HYDREA) 500 MG capsule TAKE 2 CAPSULES(1000 MG) BY MOUTH DAILY. MAY TAKE WITH FOOD TO MINIMIZE GI SIDE EFFECTS 180 capsule 0   naproxen sodium (ALEVE) 220 MG tablet Take 220 mg by mouth.     telmisartan-hydrochlorothiazide (MICARDIS HCT) 80-25 MG tablet TK 1 T PO QD  1   No current facility-administered medications for this visit.    OBJECTIVE: African-American woman who appears younger than stated age  48:   01/23/21 0945  BP: 126/64  Pulse: 77  Resp: 16  Temp: (!) 97.5 F (36.4 C)  SpO2: 100%     Body mass index is 33.55 kg/m.   Wt Readings from Last 3 Encounters:  01/23/21 201 lb 9.6 oz (91.4 kg)  10/18/19 191 lb 8 oz (86.9 kg)  09/13/19 195 lb 3.2 oz (88.5 kg)      ECOG FS:0 - Asymptomatic   Sclerae unicteric, EOMs intact Wearing a mask No cervical or supraclavicular adenopathy Lungs no rales or rhonchi Heart regular rate and rhythm Abd soft, nontender, positive bowel sounds MSK no focal spinal tenderness, no upper extremity lymphedema Neuro: nonfocal, well oriented, appropriate affect Breasts: Deferred   LAB RESULTS:  CMP     Component Value Date/Time   NA 142 04/18/2020 1418   K 4.0 04/18/2020 1418   CL 105 04/18/2020 1418   CO2 29 04/18/2020 1418   GLUCOSE 102 (H) 04/18/2020 1418   BUN 13 04/18/2020 1418   CREATININE 1.00 04/18/2020 1418   CREATININE 1.09 (H) 08/23/2019 1159   CALCIUM 10.6 (H) 04/18/2020 1418   PROT 6.8 04/18/2020 1418   ALBUMIN 3.9 04/18/2020 1418   AST 17 04/18/2020 1418   AST 30 08/23/2019 1159   ALT 17 04/18/2020 1418   ALT 30 08/23/2019 1159   ALKPHOS 96 04/18/2020 1418   BILITOT 1.8 (H) 04/18/2020 1418   BILITOT 1.7 (H) 08/23/2019 1159   GFRNONAA >60 04/18/2020 1418   GFRNONAA 54 (L) 08/23/2019 1159   GFRAA >60  08/23/2019 1159    No results found for: TOTALPROTELP, ALBUMINELP, A1GS, A2GS, BETS, BETA2SER, GAMS, MSPIKE, SPEI  No results found for: KPAFRELGTCHN, LAMBDASER, KAPLAMBRATIO  Lab Results  Component Value Date   WBC 3.8 (L) 01/23/2021   NEUTROABS 2.4 01/23/2021   HGB 13.2 01/23/2021   HCT 39.3 01/23/2021   MCV 104.0 (H) 01/23/2021   PLT 363 01/23/2021      Chemistry  Component Value Date/Time   NA 142 04/18/2020 1418   K 4.0 04/18/2020 1418   CL 105 04/18/2020 1418   CO2 29 04/18/2020 1418   BUN 13 04/18/2020 1418   CREATININE 1.00 04/18/2020 1418   CREATININE 1.09 (H) 08/23/2019 1159      Component Value Date/Time   CALCIUM 10.6 (H) 04/18/2020 1418   ALKPHOS 96 04/18/2020 1418   AST 17 04/18/2020 1418   AST 30 08/23/2019 1159   ALT 17 04/18/2020 1418   ALT 30 08/23/2019 1159   BILITOT 1.8 (H) 04/18/2020 1418   BILITOT 1.7 (H) 08/23/2019 1159       No results found for: LABCA2  No components found for: [id number]  No results for input(s): INR in the last 168 hours.  No results found for: LABCA2  No results found for: VVO160  No results found for: VPX106  No results found for: YIR485  No results found for: CA2729  No components found for: HGQUANT  No results found for: CEA1 / No results found for: CEA1   No results found for: AFPTUMOR  No results found for: CHROMOGRNA  No results found for: HGBA, HGBA2QUANT, HGBFQUANT, HGBSQUAN (Hemoglobinopathy evaluation)   No results found for: LDH  Lab Results  Component Value Date   IRON 94 08/23/2019   TIBC 313 08/23/2019   IRONPCTSAT 30 08/23/2019   (Iron and TIBC)  Lab Results  Component Value Date   FERRITIN 162 08/23/2019    Urinalysis No results found for: COLORURINE, APPEARANCEUR, LABSPEC, PHURINE, GLUCOSEU, HGBUR, BILIRUBINUR, KETONESUR, PROTEINUR, UROBILINOGEN, NITRITE, LEUKOCYTESUR   STUDIES: No results found.   ASSESSMENT: 65 y.o. [place] woman who is here today for  evaluation of increasing thrombocytosis.    (1) essential thrombocytosis  (a) platelet count 682 on 07/12/2019, repeat 08/23/2019 was 890  (b) screening studies showed normal red cell and white cells number and morphology, normal iron studies, normal C-reactive protein and sedimentation rate and negative ANA  (c) bone marrow biopsy 09/09/2019 shows an increase in atypical megakaryocytes, no white cell dysplasia or increase in blasts, no increase in reticulin or evidence of fibrosis, present iron stores  (d) genomics 09/02/2019 shows a mutation in JAK2 p.Val617Phe; no mutations in JAK2 exon 12, MPL or CALR  (2) Hydrea started 09/13/2019 at 500 mg daily  (a) dose increased to 1000 mg daily as of 10/19/2019   PLAN: Lites is tolerating Hydrea well without any side effects that she is aware of.  This is maintaining her platelet count in the normal range with a normal hemoglobin and white cell count.  There have been no clotting or bleeding complications.  She tells me the cost of the medication is reasonable  Accordingly the plan is to continue monitoring.  She will have lab work every 41month and see uKoreaonce a year in September.  She will continue the Hydrea at the current dose, 1 g daily, indefinitely.  Total encounter time 20 minutes.*Sarajane JewsC. Gladyse Corvin, MD Medical Oncology and Hematology [facility] [place] [address]Tel. [phone]   Fax. [phone]  I, KWilburn Mylar am acting as scribe for Dr. GVirgie Dad Khan Chura.  I, GLurline DelMD, have reviewed the above documentation for accuracy and completeness, and I agree with the above.   *Total Encounter Time as defined by the Centers for Medicare and Medicaid Services includes, in addition to the face-to-face time of a patient visit (documented in the note above)  non-face-to-face time: obtaining and reviewing outside history, ordering and reviewing medications, tests or procedures, care  coordination (communications with other health care professionals or caregivers) and documentation in the medical record.

## 2021-01-23 ENCOUNTER — Inpatient Hospital Stay: Payer: Medicare PPO

## 2021-01-23 ENCOUNTER — Inpatient Hospital Stay: Payer: Medicare PPO | Attending: Adult Health | Admitting: Oncology

## 2021-01-23 ENCOUNTER — Other Ambulatory Visit: Payer: Self-pay

## 2021-01-23 VITALS — BP 126/64 | HR 77 | Temp 97.5°F | Resp 16 | Ht 65.0 in | Wt 201.6 lb

## 2021-01-23 DIAGNOSIS — D473 Essential (hemorrhagic) thrombocythemia: Secondary | ICD-10-CM | POA: Insufficient documentation

## 2021-01-23 DIAGNOSIS — Z79899 Other long term (current) drug therapy: Secondary | ICD-10-CM | POA: Insufficient documentation

## 2021-01-23 LAB — CBC WITH DIFFERENTIAL/PLATELET
Abs Immature Granulocytes: 0.01 10*3/uL (ref 0.00–0.07)
Basophils Absolute: 0 10*3/uL (ref 0.0–0.1)
Basophils Relative: 1 %
Eosinophils Absolute: 0 10*3/uL (ref 0.0–0.5)
Eosinophils Relative: 1 %
HCT: 39.3 % (ref 36.0–46.0)
Hemoglobin: 13.2 g/dL (ref 12.0–15.0)
Immature Granulocytes: 0 %
Lymphocytes Relative: 27 %
Lymphs Abs: 1 10*3/uL (ref 0.7–4.0)
MCH: 34.9 pg — ABNORMAL HIGH (ref 26.0–34.0)
MCHC: 33.6 g/dL (ref 30.0–36.0)
MCV: 104 fL — ABNORMAL HIGH (ref 80.0–100.0)
Monocytes Absolute: 0.3 10*3/uL (ref 0.1–1.0)
Monocytes Relative: 7 %
Neutro Abs: 2.4 10*3/uL (ref 1.7–7.7)
Neutrophils Relative %: 64 %
Platelets: 363 10*3/uL (ref 150–400)
RBC: 3.78 MIL/uL — ABNORMAL LOW (ref 3.87–5.11)
RDW: 12.2 % (ref 11.5–15.5)
WBC: 3.8 10*3/uL — ABNORMAL LOW (ref 4.0–10.5)
nRBC: 0 % (ref 0.0–0.2)

## 2021-01-23 MED ORDER — HYDROXYUREA 500 MG PO CAPS: ORAL_CAPSULE | ORAL | 4 refills | Status: DC

## 2021-04-24 ENCOUNTER — Inpatient Hospital Stay: Payer: Medicare PPO | Attending: Adult Health

## 2021-04-24 ENCOUNTER — Inpatient Hospital Stay: Payer: Medicare PPO

## 2021-04-24 ENCOUNTER — Other Ambulatory Visit: Payer: Self-pay

## 2021-04-24 DIAGNOSIS — D75839 Thrombocytosis, unspecified: Secondary | ICD-10-CM | POA: Diagnosis not present

## 2021-04-24 DIAGNOSIS — D473 Essential (hemorrhagic) thrombocythemia: Secondary | ICD-10-CM | POA: Diagnosis not present

## 2021-04-24 LAB — CBC WITH DIFFERENTIAL/PLATELET
Abs Immature Granulocytes: 0.01 10*3/uL (ref 0.00–0.07)
Basophils Absolute: 0 10*3/uL (ref 0.0–0.1)
Basophils Relative: 0 %
Eosinophils Absolute: 0.1 10*3/uL (ref 0.0–0.5)
Eosinophils Relative: 1 %
HCT: 36.1 % (ref 36.0–46.0)
Hemoglobin: 12.1 g/dL (ref 12.0–15.0)
Immature Granulocytes: 0 %
Lymphocytes Relative: 31 %
Lymphs Abs: 1.5 10*3/uL (ref 0.7–4.0)
MCH: 34.8 pg — ABNORMAL HIGH (ref 26.0–34.0)
MCHC: 33.5 g/dL (ref 30.0–36.0)
MCV: 103.7 fL — ABNORMAL HIGH (ref 80.0–100.0)
Monocytes Absolute: 0.4 10*3/uL (ref 0.1–1.0)
Monocytes Relative: 8 %
Neutro Abs: 2.9 10*3/uL (ref 1.7–7.7)
Neutrophils Relative %: 60 %
Platelets: 314 10*3/uL (ref 150–400)
RBC: 3.48 MIL/uL — ABNORMAL LOW (ref 3.87–5.11)
RDW: 12.1 % (ref 11.5–15.5)
WBC: 4.8 10*3/uL (ref 4.0–10.5)
nRBC: 0 % (ref 0.0–0.2)

## 2021-06-18 DIAGNOSIS — R202 Paresthesia of skin: Secondary | ICD-10-CM | POA: Diagnosis not present

## 2021-07-23 ENCOUNTER — Other Ambulatory Visit: Payer: TRICARE For Life (TFL)

## 2021-07-24 ENCOUNTER — Other Ambulatory Visit: Payer: Self-pay

## 2021-07-24 ENCOUNTER — Inpatient Hospital Stay: Payer: Medicare PPO | Attending: Adult Health

## 2021-07-24 DIAGNOSIS — D473 Essential (hemorrhagic) thrombocythemia: Secondary | ICD-10-CM | POA: Diagnosis not present

## 2021-07-24 LAB — CMP (CANCER CENTER ONLY)
ALT: 13 U/L (ref 0–44)
AST: 14 U/L — ABNORMAL LOW (ref 15–41)
Albumin: 3.9 g/dL (ref 3.5–5.0)
Alkaline Phosphatase: 100 U/L (ref 38–126)
Anion gap: 7 (ref 5–15)
BUN: 14 mg/dL (ref 8–23)
CO2: 28 mmol/L (ref 22–32)
Calcium: 9.9 mg/dL (ref 8.9–10.3)
Chloride: 99 mmol/L (ref 98–111)
Creatinine: 0.91 mg/dL (ref 0.44–1.00)
GFR, Estimated: >60 mL/min (ref >60)
Glucose, Bld: 85 mg/dL (ref 70–99)
Potassium: 3.4 mmol/L — ABNORMAL LOW (ref 3.5–5.1)
Sodium: 134 mmol/L — ABNORMAL LOW (ref 135–145)
Total Bilirubin: 1.4 mg/dL — ABNORMAL HIGH (ref 0.3–1.2)
Total Protein: 6.1 g/dL — ABNORMAL LOW (ref 6.5–8.1)

## 2021-07-24 LAB — CBC (CANCER CENTER ONLY)
HCT: 35.3 % — ABNORMAL LOW (ref 36.0–46.0)
Hemoglobin: 12.4 g/dL (ref 12.0–15.0)
MCH: 35.6 pg — ABNORMAL HIGH (ref 26.0–34.0)
MCHC: 35.1 g/dL (ref 30.0–36.0)
MCV: 101.4 fL — ABNORMAL HIGH (ref 80.0–100.0)
Platelet Count: 313 10*3/uL (ref 150–400)
RBC: 3.48 MIL/uL — ABNORMAL LOW (ref 3.87–5.11)
RDW: 12.2 % (ref 11.5–15.5)
WBC Count: 4.1 10*3/uL (ref 4.0–10.5)
nRBC: 0 % (ref 0.0–0.2)

## 2021-07-31 ENCOUNTER — Telehealth: Payer: Self-pay

## 2021-07-31 NOTE — Telephone Encounter (Signed)
Called pt to advise that labs are stable. Continue on Hydrea as prescribed per Dede Query PA. ?

## 2021-10-05 DIAGNOSIS — Z1389 Encounter for screening for other disorder: Secondary | ICD-10-CM | POA: Diagnosis not present

## 2021-10-05 DIAGNOSIS — Z0000 Encounter for general adult medical examination without abnormal findings: Secondary | ICD-10-CM | POA: Diagnosis not present

## 2021-10-05 DIAGNOSIS — D473 Essential (hemorrhagic) thrombocythemia: Secondary | ICD-10-CM | POA: Diagnosis not present

## 2021-10-05 DIAGNOSIS — R000 Tachycardia, unspecified: Secondary | ICD-10-CM | POA: Diagnosis not present

## 2021-10-05 DIAGNOSIS — Z23 Encounter for immunization: Secondary | ICD-10-CM | POA: Diagnosis not present

## 2021-10-05 DIAGNOSIS — Z1211 Encounter for screening for malignant neoplasm of colon: Secondary | ICD-10-CM | POA: Diagnosis not present

## 2021-10-05 DIAGNOSIS — I10 Essential (primary) hypertension: Secondary | ICD-10-CM | POA: Diagnosis not present

## 2021-10-23 ENCOUNTER — Other Ambulatory Visit: Payer: Self-pay

## 2021-10-23 ENCOUNTER — Other Ambulatory Visit: Payer: Self-pay | Admitting: Hematology and Oncology

## 2021-10-23 ENCOUNTER — Other Ambulatory Visit: Payer: Self-pay | Admitting: Lab

## 2021-10-23 ENCOUNTER — Other Ambulatory Visit: Payer: TRICARE For Life (TFL)

## 2021-10-23 ENCOUNTER — Inpatient Hospital Stay: Payer: Medicare PPO | Attending: Adult Health

## 2021-10-23 DIAGNOSIS — D473 Essential (hemorrhagic) thrombocythemia: Secondary | ICD-10-CM | POA: Diagnosis not present

## 2021-10-23 LAB — CMP (CANCER CENTER ONLY)
ALT: 14 U/L (ref 0–44)
AST: 14 U/L — ABNORMAL LOW (ref 15–41)
Albumin: 4.1 g/dL (ref 3.5–5.0)
Alkaline Phosphatase: 85 U/L (ref 38–126)
Anion gap: 4 — ABNORMAL LOW (ref 5–15)
BUN: 23 mg/dL (ref 8–23)
CO2: 30 mmol/L (ref 22–32)
Calcium: 10.6 mg/dL — ABNORMAL HIGH (ref 8.9–10.3)
Chloride: 104 mmol/L (ref 98–111)
Creatinine: 0.91 mg/dL (ref 0.44–1.00)
GFR, Estimated: >60 mL/min (ref >60)
Glucose, Bld: 93 mg/dL (ref 70–99)
Potassium: 3.9 mmol/L (ref 3.5–5.1)
Sodium: 138 mmol/L (ref 135–145)
Total Bilirubin: 1.5 mg/dL — ABNORMAL HIGH (ref 0.3–1.2)
Total Protein: 6.7 g/dL (ref 6.5–8.1)

## 2021-10-23 LAB — CBC WITH DIFFERENTIAL (CANCER CENTER ONLY)
Abs Immature Granulocytes: 0.02 10*3/uL (ref 0.00–0.07)
Basophils Absolute: 0 10*3/uL (ref 0.0–0.1)
Basophils Relative: 1 %
Eosinophils Absolute: 0.1 10*3/uL (ref 0.0–0.5)
Eosinophils Relative: 1 %
HCT: 36.8 % (ref 36.0–46.0)
Hemoglobin: 12.7 g/dL (ref 12.0–15.0)
Immature Granulocytes: 0 %
Lymphocytes Relative: 33 %
Lymphs Abs: 1.7 10*3/uL (ref 0.7–4.0)
MCH: 35.7 pg — ABNORMAL HIGH (ref 26.0–34.0)
MCHC: 34.5 g/dL (ref 30.0–36.0)
MCV: 103.4 fL — ABNORMAL HIGH (ref 80.0–100.0)
Monocytes Absolute: 0.4 10*3/uL (ref 0.1–1.0)
Monocytes Relative: 7 %
Neutro Abs: 3 10*3/uL (ref 1.7–7.7)
Neutrophils Relative %: 58 %
Platelet Count: 387 10*3/uL (ref 150–400)
RBC: 3.56 MIL/uL — ABNORMAL LOW (ref 3.87–5.11)
RDW: 12 % (ref 11.5–15.5)
WBC Count: 5.2 10*3/uL (ref 4.0–10.5)
nRBC: 0 % (ref 0.0–0.2)

## 2021-10-26 DIAGNOSIS — Z01419 Encounter for gynecological examination (general) (routine) without abnormal findings: Secondary | ICD-10-CM | POA: Diagnosis not present

## 2021-10-26 DIAGNOSIS — Z1231 Encounter for screening mammogram for malignant neoplasm of breast: Secondary | ICD-10-CM | POA: Diagnosis not present

## 2021-10-26 DIAGNOSIS — Z800 Family history of malignant neoplasm of digestive organs: Secondary | ICD-10-CM | POA: Diagnosis not present

## 2021-10-26 DIAGNOSIS — Z124 Encounter for screening for malignant neoplasm of cervix: Secondary | ICD-10-CM | POA: Diagnosis not present

## 2021-10-26 DIAGNOSIS — E2839 Other primary ovarian failure: Secondary | ICD-10-CM | POA: Diagnosis not present

## 2021-10-26 DIAGNOSIS — Z0142 Encounter for cervical smear to confirm findings of recent normal smear following initial abnormal smear: Secondary | ICD-10-CM | POA: Diagnosis not present

## 2021-10-26 DIAGNOSIS — Z01411 Encounter for gynecological examination (general) (routine) with abnormal findings: Secondary | ICD-10-CM | POA: Diagnosis not present

## 2021-10-26 DIAGNOSIS — Z6832 Body mass index (BMI) 32.0-32.9, adult: Secondary | ICD-10-CM | POA: Diagnosis not present

## 2021-11-09 DIAGNOSIS — Z780 Asymptomatic menopausal state: Secondary | ICD-10-CM | POA: Diagnosis not present

## 2021-12-07 IMAGING — CT CT BIOPSY AND ASPIRATION BONE MARROW
1 of 2 series · 15 of 29 positions shown, 19 images · non-contrast
Comparison: none

INDICATION: Essential thrombocytosis. Please perform CT-guided bone marrow
biopsy for tissue diagnostic purposes.

[Series 2: i-spiral 5.0 b40f · axial · 0.98mm/px · z∈[-158,-88]mm · 15 of 24 slices shown, 19 images]
[im 2/24  mediastinal]
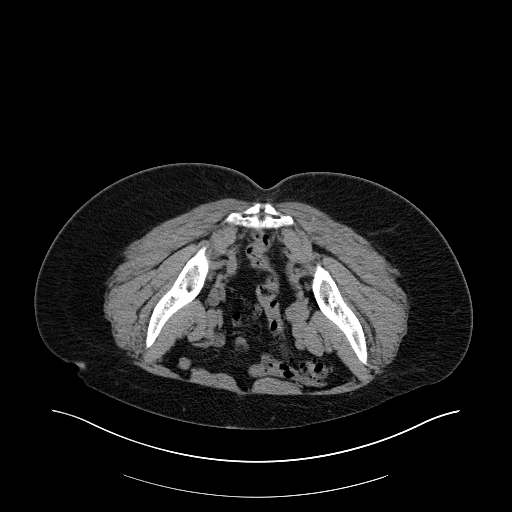
[im 2/24  lung]
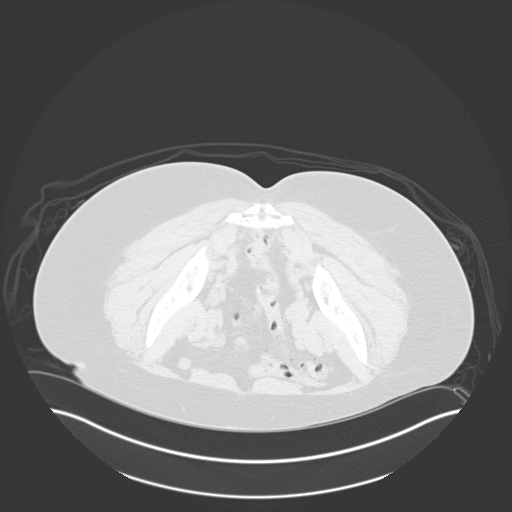
[im 4/24  lung]
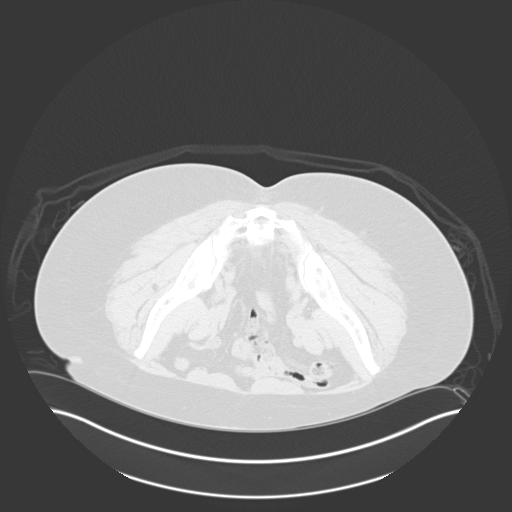
[im 5/24  lung]
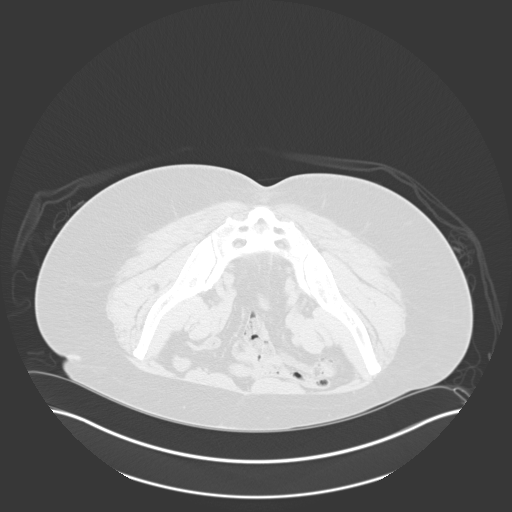
[im 6/24  lung]
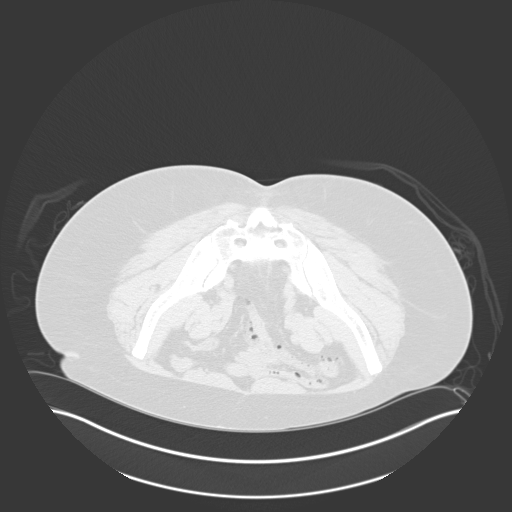
[im 8/24  mediastinal]
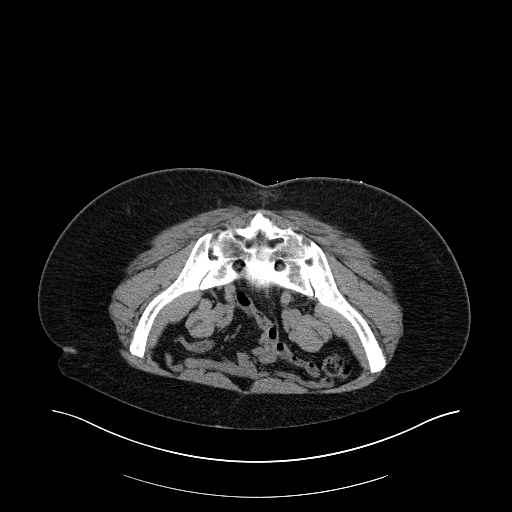
[im 8/24  lung]
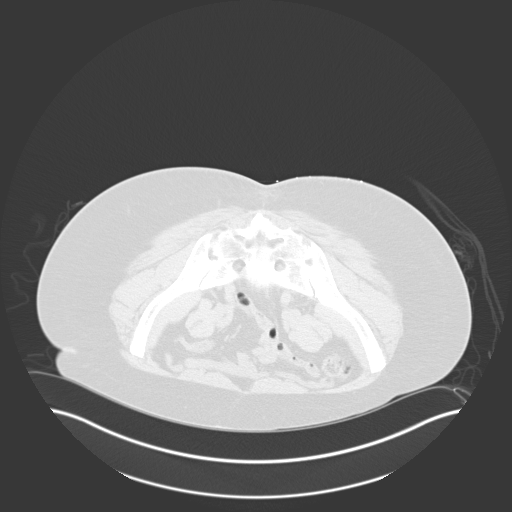
[im 9/24  lung]
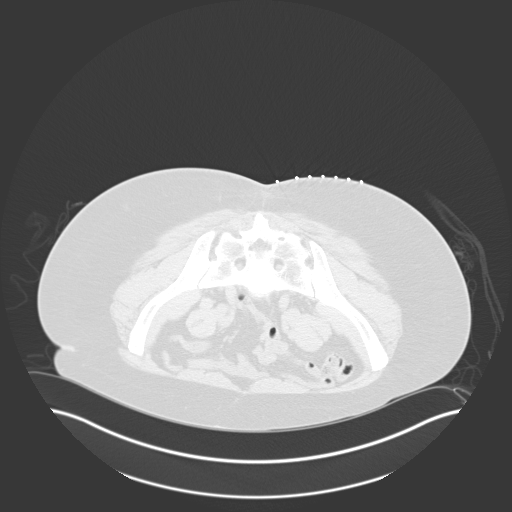
[im 11/24  lung]
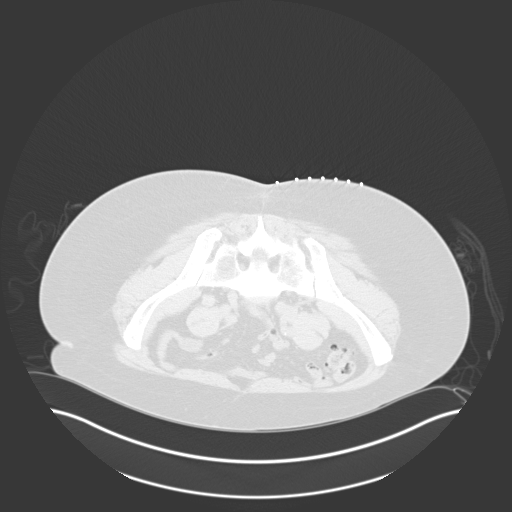
[im 12/24  lung]
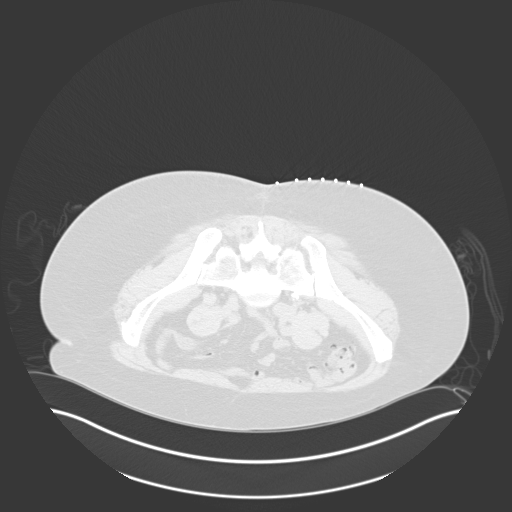
[im 13/24  mediastinal]
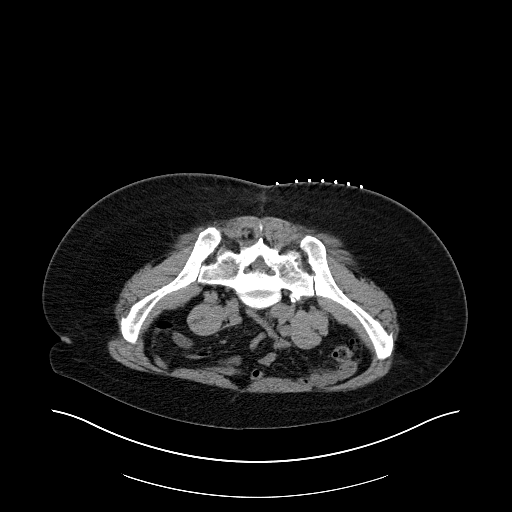
[im 13/24  lung]
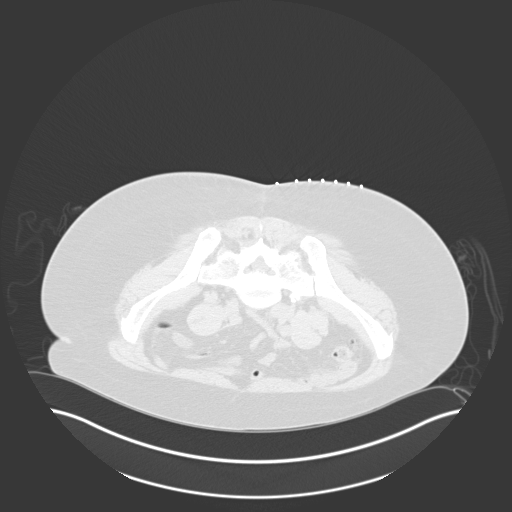
[im 15/24  lung]
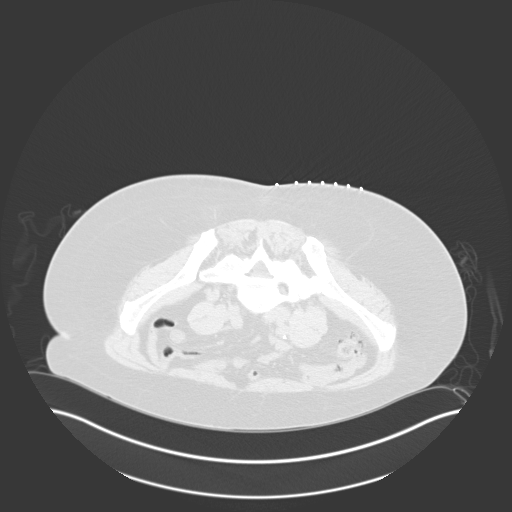
[im 16/24  lung]
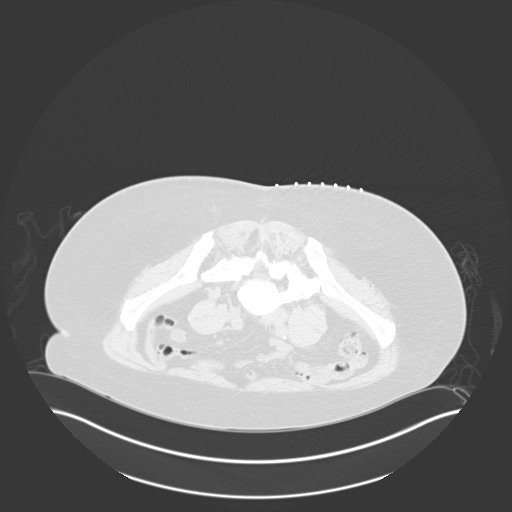
[im 18/24  lung]
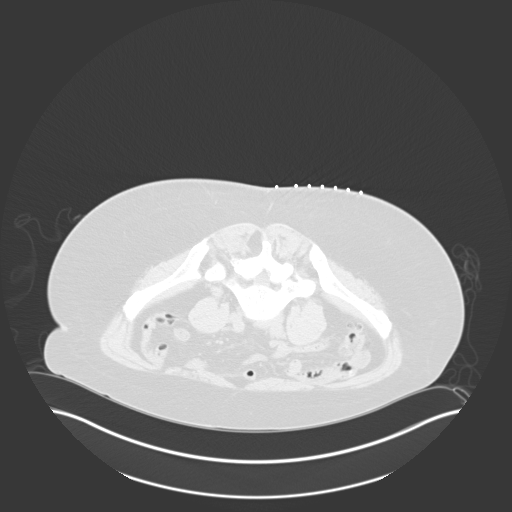
[im 19/24  mediastinal]
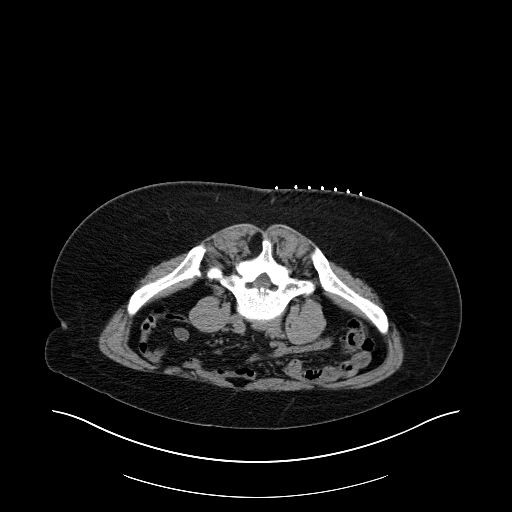
[im 19/24  lung]
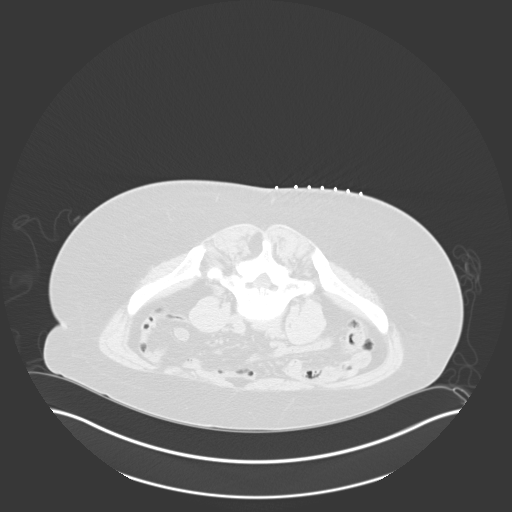
[im 20/24  lung]
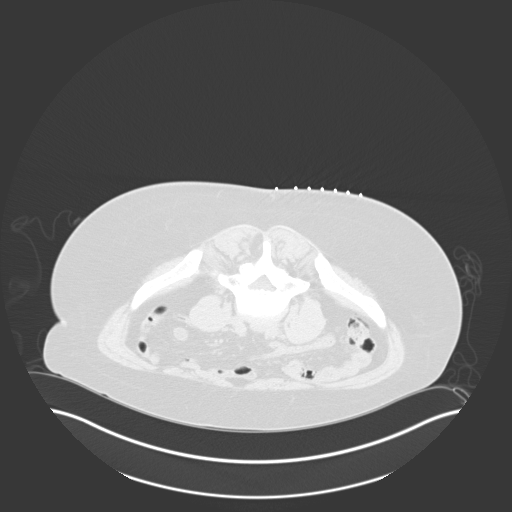
[im 22/24  lung]
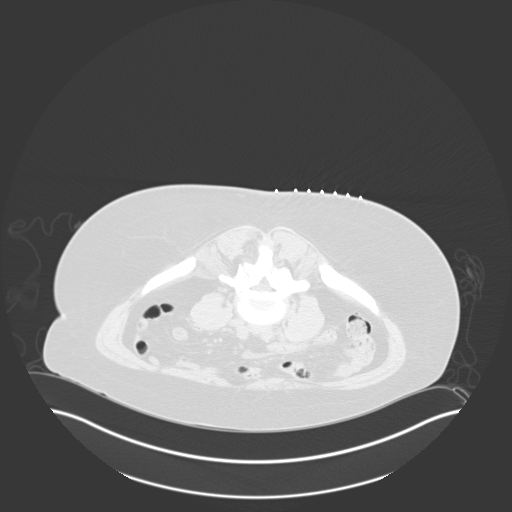

[15 of 29 positions shown; findings below may reference images not displayed]

EXAM:
CT-GUIDED BONE MARROW BIOPSY AND ASPIRATION

MEDICATIONS:
None

ANESTHESIA/SEDATION:
Fentanyl 100 mcg IV; Versed 3 mg IV

Sedation Time: 10 Minutes; The patient was continuously monitored
during the procedure by the interventional radiology nurse under my
direct supervision.

COMPLICATIONS:
None immediate.

PROCEDURE:
Informed consent was obtained from the patient following an
explanation of the procedure, risks, benefits and alternatives. The
patient understands, agrees and consents for the procedure. All
questions were addressed. A time out was performed prior to the
initiation of the procedure.

The patient was positioned prone and non-contrast localization CT
was performed of the pelvis to demonstrate the iliac marrow spaces.
The operative site was prepped and draped in the usual sterile
fashion.

Under sterile conditions and local anesthesia, a 22 gauge spinal
needle was utilized for procedural planning. Next, an 11 gauge
coaxial bone biopsy needle was advanced into the left iliac marrow
space. Needle position was confirmed with CT imaging. Initially, a
bone marrow aspiration was performed. Next, a bone marrow biopsy was
obtained with the 11 gauge outer bone marrow device. Samples were
prepared with the cytotechnologist and deemed adequate. The needle
was removed and superficial hemostasis was obtained with manual
compression. A dressing was applied. The patient tolerated the
procedure well without immediate post procedural complication.
IMPRESSION: Successful CT guided left iliac bone marrow aspiration and core
biopsy.

## 2021-12-07 IMAGING — CT CT BIOPSY
1 of 2 series · 15 of 29 positions shown, 19 images · non-contrast
Comparison: none

INDICATION: Essential thrombocytosis. Please perform CT-guided bone marrow
biopsy for tissue diagnostic purposes.

[Series 2: i-spiral 5.0 b40f · axial · 0.98mm/px · z∈[-158,-88]mm · 15 of 24 slices shown, 19 images]
[im 2/24  mediastinal]
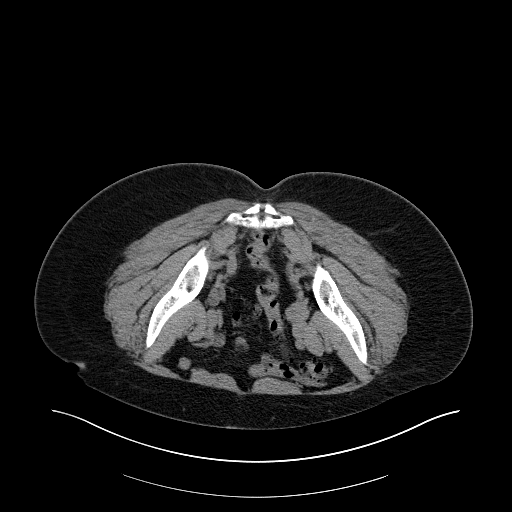
[im 2/24  lung]
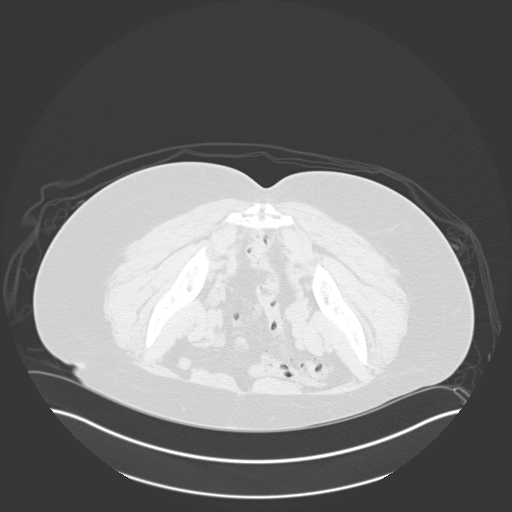
[im 4/24  lung]
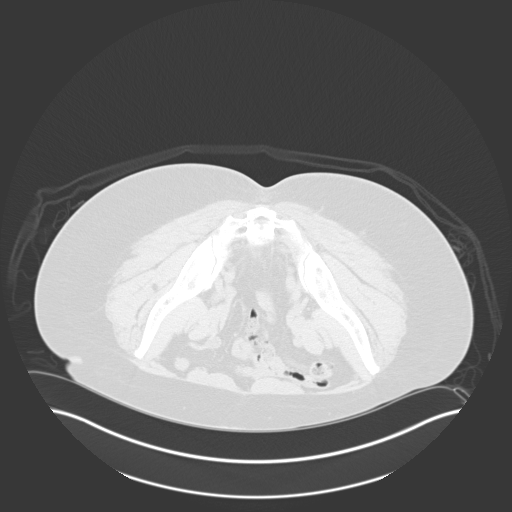
[im 5/24  lung]
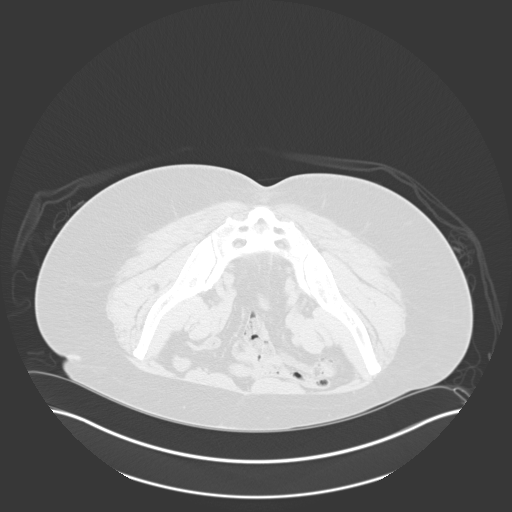
[im 6/24  lung]
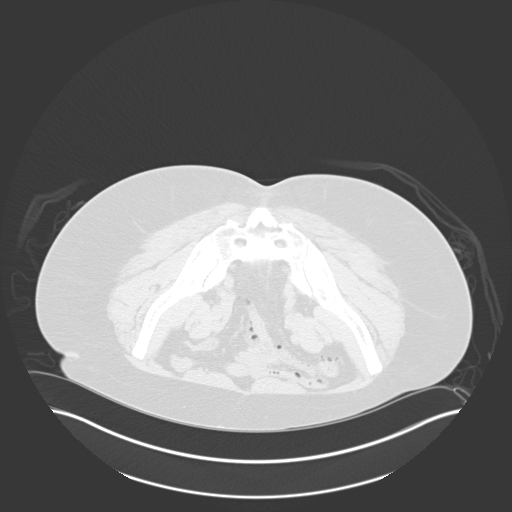
[im 8/24  mediastinal]
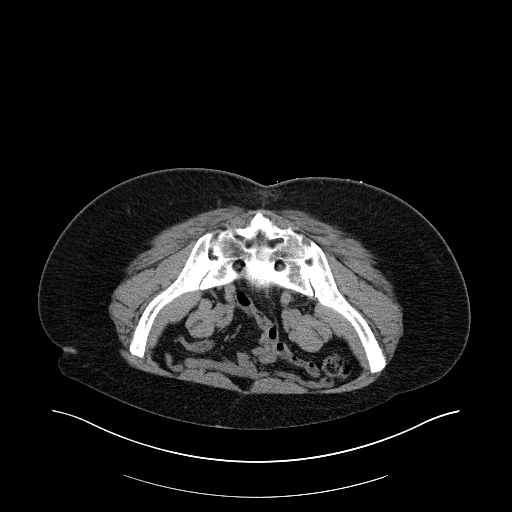
[im 8/24  lung]
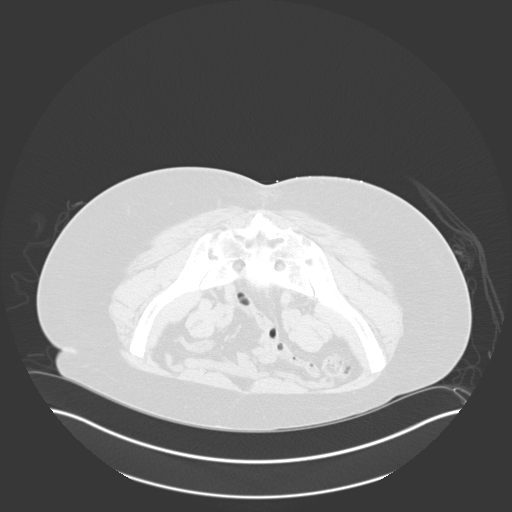
[im 9/24  lung]
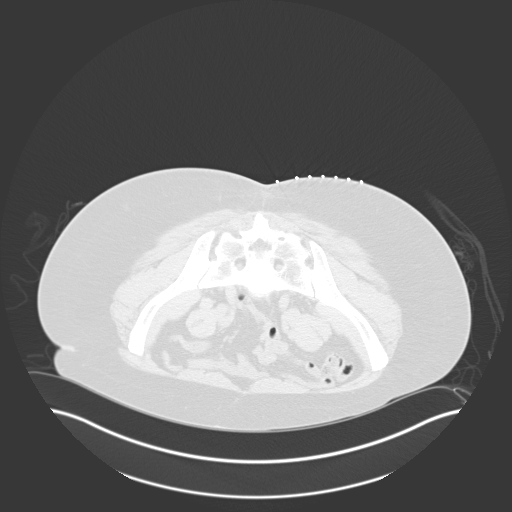
[im 11/24  lung]
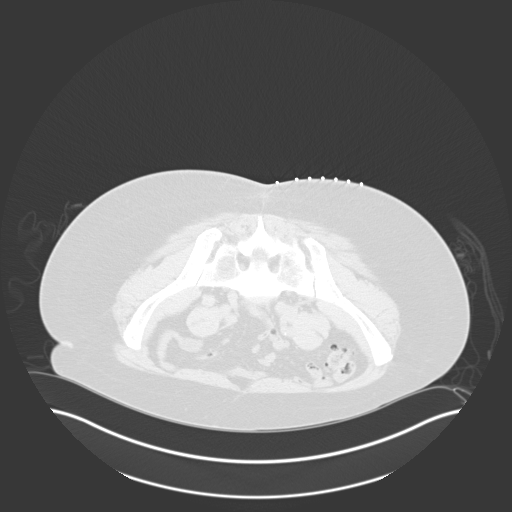
[im 12/24  lung]
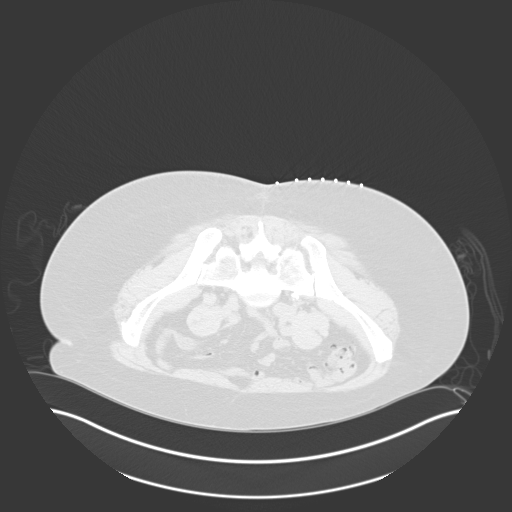
[im 13/24  mediastinal]
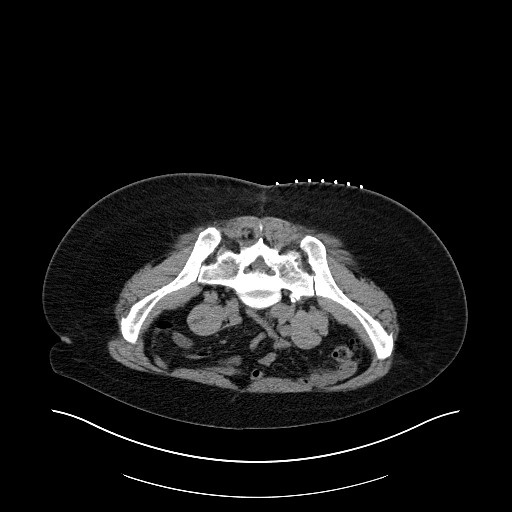
[im 13/24  lung]
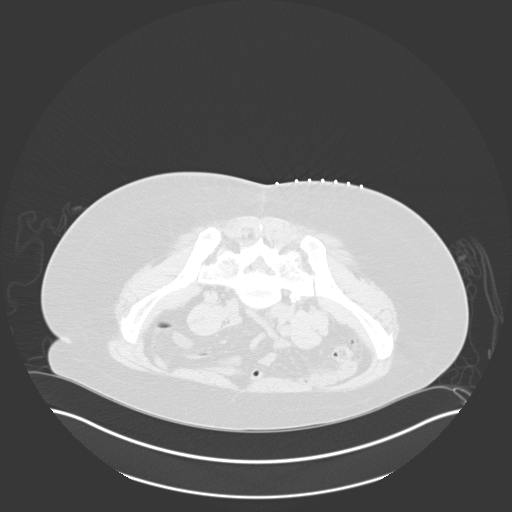
[im 15/24  lung]
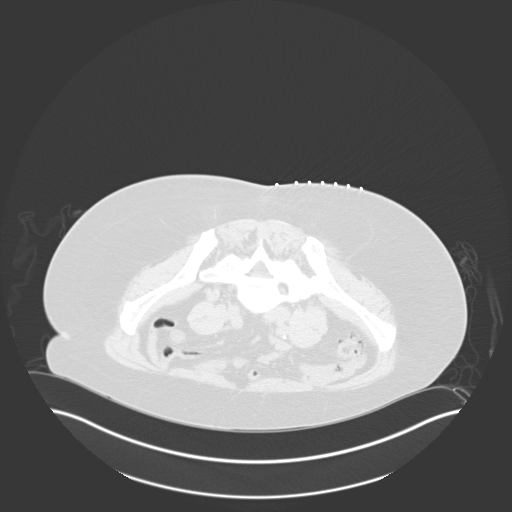
[im 16/24  lung]
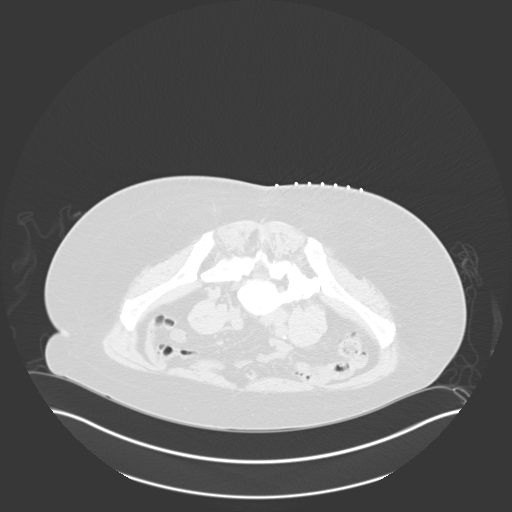
[im 18/24  lung]
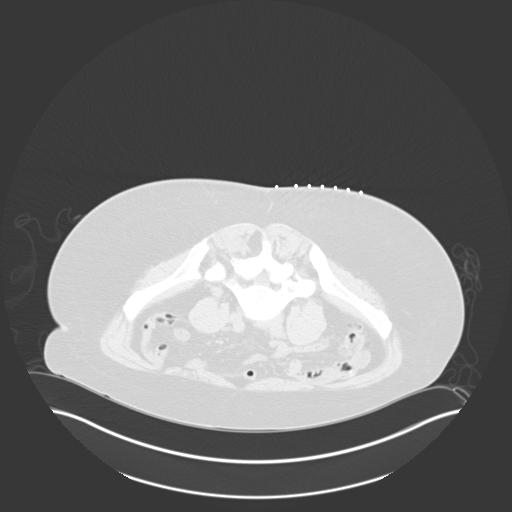
[im 19/24  mediastinal]
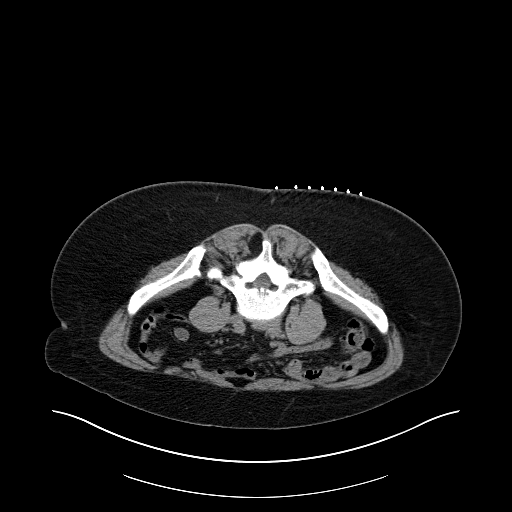
[im 19/24  lung]
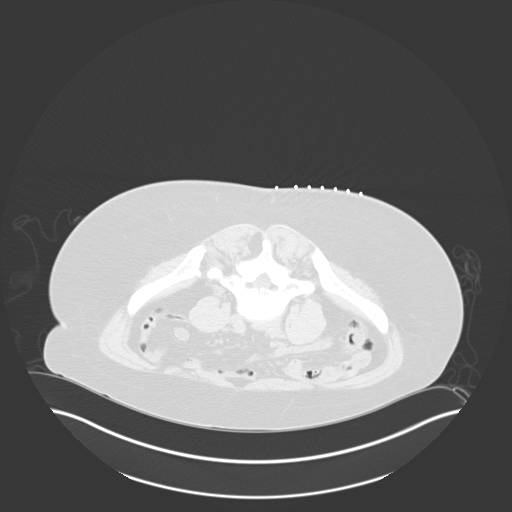
[im 20/24  lung]
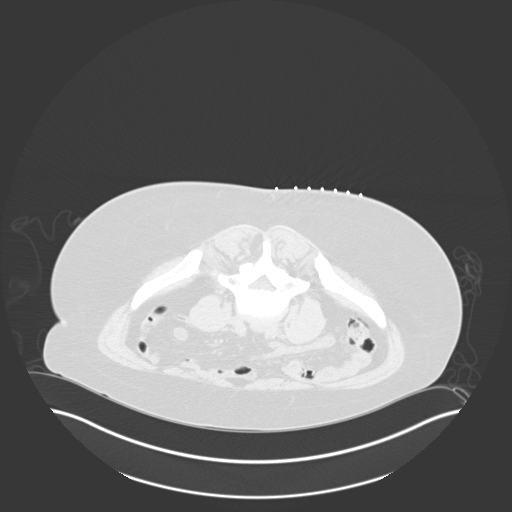
[im 22/24  lung]
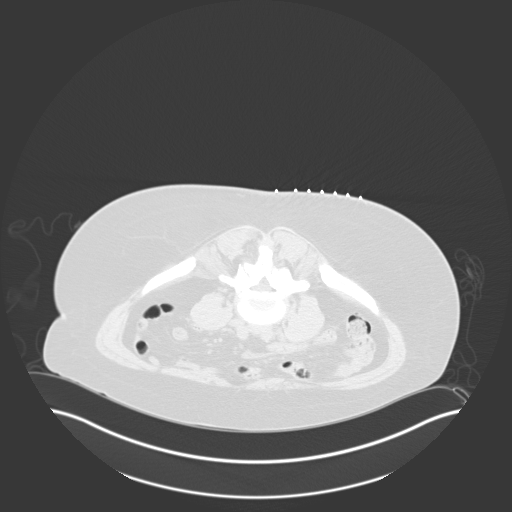

[15 of 29 positions shown; findings below may reference images not displayed]

EXAM:
CT-GUIDED BONE MARROW BIOPSY AND ASPIRATION

MEDICATIONS:
None

ANESTHESIA/SEDATION:
Fentanyl 100 mcg IV; Versed 3 mg IV

Sedation Time: 10 Minutes; The patient was continuously monitored
during the procedure by the interventional radiology nurse under my
direct supervision.

COMPLICATIONS:
None immediate.

PROCEDURE:
Informed consent was obtained from the patient following an
explanation of the procedure, risks, benefits and alternatives. The
patient understands, agrees and consents for the procedure. All
questions were addressed. A time out was performed prior to the
initiation of the procedure.

The patient was positioned prone and non-contrast localization CT
was performed of the pelvis to demonstrate the iliac marrow spaces.
The operative site was prepped and draped in the usual sterile
fashion.

Under sterile conditions and local anesthesia, a 22 gauge spinal
needle was utilized for procedural planning. Next, an 11 gauge
coaxial bone biopsy needle was advanced into the left iliac marrow
space. Needle position was confirmed with CT imaging. Initially, a
bone marrow aspiration was performed. Next, a bone marrow biopsy was
obtained with the 11 gauge outer bone marrow device. Samples were
prepared with the cytotechnologist and deemed adequate. The needle
was removed and superficial hemostasis was obtained with manual
compression. A dressing was applied. The patient tolerated the
procedure well without immediate post procedural complication.
IMPRESSION: Successful CT guided left iliac bone marrow aspiration and core
biopsy.

## 2022-01-23 ENCOUNTER — Encounter: Payer: Self-pay | Admitting: Hematology and Oncology

## 2022-01-23 ENCOUNTER — Other Ambulatory Visit: Payer: Self-pay

## 2022-01-23 ENCOUNTER — Inpatient Hospital Stay (HOSPITAL_BASED_OUTPATIENT_CLINIC_OR_DEPARTMENT_OTHER): Payer: Medicare PPO | Admitting: Hematology and Oncology

## 2022-01-23 ENCOUNTER — Inpatient Hospital Stay: Payer: Medicare PPO | Attending: Adult Health

## 2022-01-23 VITALS — BP 130/69 | HR 71 | Temp 98.3°F | Resp 14 | Wt 207.9 lb

## 2022-01-23 DIAGNOSIS — D75839 Thrombocytosis, unspecified: Secondary | ICD-10-CM | POA: Insufficient documentation

## 2022-01-23 DIAGNOSIS — Z800 Family history of malignant neoplasm of digestive organs: Secondary | ICD-10-CM | POA: Insufficient documentation

## 2022-01-23 DIAGNOSIS — D473 Essential (hemorrhagic) thrombocythemia: Secondary | ICD-10-CM

## 2022-01-23 DIAGNOSIS — I10 Essential (primary) hypertension: Secondary | ICD-10-CM | POA: Diagnosis not present

## 2022-01-23 DIAGNOSIS — Z90710 Acquired absence of both cervix and uterus: Secondary | ICD-10-CM | POA: Diagnosis not present

## 2022-01-23 DIAGNOSIS — Z87891 Personal history of nicotine dependence: Secondary | ICD-10-CM | POA: Diagnosis not present

## 2022-01-23 LAB — CBC WITH DIFFERENTIAL (CANCER CENTER ONLY)
Abs Immature Granulocytes: 0 10*3/uL (ref 0.00–0.07)
Basophils Absolute: 0 10*3/uL (ref 0.0–0.1)
Basophils Relative: 1 %
Eosinophils Absolute: 0 10*3/uL (ref 0.0–0.5)
Eosinophils Relative: 1 %
HCT: 40.5 % (ref 36.0–46.0)
Hemoglobin: 13.8 g/dL (ref 12.0–15.0)
Immature Granulocytes: 0 %
Lymphocytes Relative: 25 %
Lymphs Abs: 1.2 10*3/uL (ref 0.7–4.0)
MCH: 35 pg — ABNORMAL HIGH (ref 26.0–34.0)
MCHC: 34.1 g/dL (ref 30.0–36.0)
MCV: 102.8 fL — ABNORMAL HIGH (ref 80.0–100.0)
Monocytes Absolute: 0.3 10*3/uL (ref 0.1–1.0)
Monocytes Relative: 7 %
Neutro Abs: 3.2 10*3/uL (ref 1.7–7.7)
Neutrophils Relative %: 66 %
Platelet Count: 408 10*3/uL — ABNORMAL HIGH (ref 150–400)
RBC: 3.94 MIL/uL (ref 3.87–5.11)
RDW: 11.8 % (ref 11.5–15.5)
WBC Count: 4.8 10*3/uL (ref 4.0–10.5)
nRBC: 0 % (ref 0.0–0.2)

## 2022-01-23 LAB — CMP (CANCER CENTER ONLY)
ALT: 10 U/L (ref 0–44)
AST: 14 U/L — ABNORMAL LOW (ref 15–41)
Albumin: 4.1 g/dL (ref 3.5–5.0)
Alkaline Phosphatase: 92 U/L (ref 38–126)
Anion gap: 3 — ABNORMAL LOW (ref 5–15)
BUN: 17 mg/dL (ref 8–23)
CO2: 30 mmol/L (ref 22–32)
Calcium: 10.7 mg/dL — ABNORMAL HIGH (ref 8.9–10.3)
Chloride: 108 mmol/L (ref 98–111)
Creatinine: 1.02 mg/dL — ABNORMAL HIGH (ref 0.44–1.00)
GFR, Estimated: >60 mL/min (ref >60)
Glucose, Bld: 108 mg/dL — ABNORMAL HIGH (ref 70–99)
Potassium: 4 mmol/L (ref 3.5–5.1)
Sodium: 141 mmol/L (ref 135–145)
Total Bilirubin: 1.1 mg/dL (ref 0.3–1.2)
Total Protein: 6.4 g/dL — ABNORMAL LOW (ref 6.5–8.1)

## 2022-01-23 MED ORDER — HYDROXYUREA 500 MG PO CAPS: 500.0000 mg | ORAL_CAPSULE | Freq: Two times a day (BID) | ORAL | 2 refills | Status: DC

## 2022-01-23 NOTE — Progress Notes (Signed)
[place] Telephone:(336) [phone]   Fax:([phone]  PROGRESS NOTE  Patient Care Team: Donald Prose, MD as PCP - General (Family Medicine)  Hematological/Oncological History # Essential Thrombocytosis, JAK2 positive  08/23/2019: NGS confirms JAK2 V617F 09/09/2019: Bone marrow biopsy results consistent with a  myeloproliferative neoplasm particularly essential thrombocythemia 01/23/2021: last visit with Dr. Jana Hakim  Interval History:  Lisa Buckley 66 y.o. female with medical history significant for essential thrombocytosis who presents for a follow up visit. The patient's last visit was on 01/23/2021 with Dr. Jana Hakim. In the interim since the last visit she believes she was going to run out of hydroxyurea therapy and has been on 1 pill/day over the last week.  On exam today Lisa Buckley reports she has been well in the interim since her last visit.  She thought she was getting another hydroxyurea medication has been taking 1 pill/day instead of her usual twice daily.  She notes that she tolerates medication well without any stomach upset, nausea, vomiting or diarrhea.  She reports that she does easily bruise but does not have any bleeding.  She notes that he is not having any ulcers in her mouth or on her ankles.  She was last seen by Dr. Jana Hakim in September 2022 has had no major changes in her health since that time.  She otherwise denies any fevers, chills, sweats, shortness of breath, chest pain.  A full 10 point ROS was otherwise negative.  Today we discussed the disease process of essential thrombocytosis and treatment surveillance moving forward.  The patient voiced understanding of the plan and was in agreement for labs every 3 months for clinic visits every 6 months.  MEDICAL HISTORY:  Past Medical History:  Diagnosis Date   HTN (hypertension)    Seasonal allergies     SURGICAL HISTORY: Past Surgical History:  Procedure Laterality Date   ABDOMINAL  HYSTERECTOMY  1992    SOCIAL HISTORY: Social History   Socioeconomic History   Marital status: Married    Spouse name: Not on file   Number of children: Not on file   Years of education: Not on file   Highest education level: Not on file  Occupational History   Not on file  Tobacco Use   Smoking status: Former   Smokeless tobacco: Never  Substance and Sexual Activity   Alcohol use: Yes    Comment: rarely   Drug use: Not on file   Sexual activity: Not on file  Other Topics Concern   Not on file  Social History Narrative   Not on file   Social Determinants of Health   Financial Resource Strain: Not on file  Food Insecurity: Not on file  Transportation Needs: Not on file  Physical Activity: Not on file  Stress: Not on file  Social Connections: Not on file  Intimate Partner Violence: Not on file    FAMILY HISTORY: Family History  Problem Relation Age of Onset   Hypertension Mother    Hypertension Father    Colon cancer Sister    Colon cancer Brother     ALLERGIES:  is allergic to morphine and related.  MEDICATIONS:  Current Outpatient Medications  Medication Sig Dispense Refill   esomeprazole (NEXIUM) 40 MG capsule Take 40 mg by mouth daily at 12 noon.     hydroxyurea (HYDREA) 500 MG capsule Take 1 capsule (500 mg total) by mouth 2 (two) times daily. May take with food to minimize GI side effects. 180 capsule 2  telmisartan-hydrochlorothiazide (MICARDIS HCT) 80-25 MG tablet TK 1 T PO QD  1   naproxen sodium (ALEVE) 220 MG tablet Take 220 mg by mouth. (Patient not taking: Reported on 01/23/2022)     No current facility-administered medications for this visit.    REVIEW OF SYSTEMS:   Constitutional: ( - ) fevers, ( - )  chills , ( - ) night sweats Eyes: ( - ) blurriness of vision, ( - ) double vision, ( - ) watery eyes Ears, nose, mouth, throat, and face: ( - ) mucositis, ( - ) sore throat Respiratory: ( - ) cough, ( - ) dyspnea, ( - )  wheezes Cardiovascular: ( - ) palpitation, ( - ) chest discomfort, ( - ) lower extremity swelling Gastrointestinal:  ( - ) nausea, ( - ) heartburn, ( - ) change in bowel habits Skin: ( - ) abnormal skin rashes Lymphatics: ( - ) new lymphadenopathy, ( - ) easy bruising Neurological: ( - ) numbness, ( - ) tingling, ( - ) new weaknesses Behavioral/Psych: ( - ) mood change, ( - ) new changes  All other systems were reviewed with the patient and are negative.  PHYSICAL EXAMINATION:  Vitals:   01/23/22 0839  BP: 130/69  Pulse: 71  Resp: 14  Temp: 98.3 F (36.8 C)  SpO2: 100%   Filed Weights   01/23/22 0839  Weight: 207 lb 14.4 oz (94.3 kg)    GENERAL: Well-appearing middle-aged African-American female, alert, no distress and comfortable SKIN: skin color, texture, turgor are normal, no rashes or significant lesions EYES: conjunctiva are pink and non-injected, sclera clear LUNGS: clear to auscultation and percussion with normal breathing effort HEART: regular rate & rhythm and no murmurs and no lower extremity edema Musculoskeletal: no cyanosis of digits and no clubbing  PSYCH: alert & oriented x 3, fluent speech NEURO: no focal motor/sensory deficits  LABORATORY DATA:  I have reviewed the data as listed    Latest Ref Rng & Units 01/23/2022    8:25 AM 10/23/2021    2:32 PM 07/24/2021    2:51 PM  CBC  WBC 4.0 - 10.5 K/uL 4.8  5.2  4.1   Hemoglobin 12.0 - 15.0 g/dL 13.8  12.7  12.4   Hematocrit 36.0 - 46.0 % 40.5  36.8  35.3   Platelets 150 - 400 K/uL 408  387  313        Latest Ref Rng & Units 01/23/2022    8:25 AM 10/23/2021    2:32 PM 07/24/2021    2:51 PM  CMP  Glucose 70 - 99 mg/dL 108  93  85   BUN 8 - 23 mg/dL 17  23  14    Creatinine 0.44 - 1.00 mg/dL 1.02  0.91  0.91   Sodium 135 - 145 mmol/L 141  138  134   Potassium 3.5 - 5.1 mmol/L 4.0  3.9  3.4   Chloride 98 - 111 mmol/L 108  104  99   CO2 22 - 32 mmol/L 30  30  28    Calcium 8.9 - 10.3 mg/dL 10.7  10.6  9.9    Total Protein 6.5 - 8.1 g/dL 6.4  6.7  6.1   Total Bilirubin 0.3 - 1.2 mg/dL 1.1  1.5  1.4   Alkaline Phos 38 - 126 U/L 92  85  100   AST 15 - 41 U/L 14  14  14    ALT 0 - 44 U/L 10  14  13  RADIOGRAPHIC STUDIES: No results found.  ASSESSMENT & PLAN Lisa Buckley 66 y.o. female with medical history significant for essential thrombocytosis who presents for a follow up visit.   At this time Mrs. Vidas's findings are consistent with a JAK2 positive essential thrombocytosis, confirmed by bone marrow biopsy.  She is currently on hydroxyurea therapy which is well-tolerated and controlling her platelets well.  Overall we will continue on this treatment plan as started by Dr. Jana Hakim.  # Essential Thrombocytosis, JAK2 positive  --continue hydroxyurea 500 mg BID for cytoreductive therapy --start ASA 81 mg PO daily --labs today show white blood cell 4.8, hemoglobin 13.8, MCV 102.8, and platelets of 408 -- Elevation platelets today due to the fact she was taking 1 pill/day rather than her usual twice daily dosing -- Return to clinic in 6 months time with a full 50-monthlabs.  No orders of the defined types were placed in this encounter.   All questions were answered. The patient knows to call the clinic with any problems, questions or concerns.  A total of more than 40 minutes were spent on this encounter with face-to-face time and non-face-to-face time, including preparing to see the patient, ordering tests and/or medications, counseling the patient and coordination of care as outlined above.   JLedell Peoples MD [facility] of [facility]ology[facility]: [phone]: [phone]: [email or web address]  01/23/2022 9:22 AM

## 2022-03-19 DIAGNOSIS — M2011 Hallux valgus (acquired), right foot: Secondary | ICD-10-CM | POA: Diagnosis not present

## 2022-03-19 DIAGNOSIS — M7662 Achilles tendinitis, left leg: Secondary | ICD-10-CM | POA: Diagnosis not present

## 2022-03-19 DIAGNOSIS — M24571 Contracture, right ankle: Secondary | ICD-10-CM | POA: Diagnosis not present

## 2022-03-19 DIAGNOSIS — M792 Neuralgia and neuritis, unspecified: Secondary | ICD-10-CM | POA: Diagnosis not present

## 2022-03-19 DIAGNOSIS — M722 Plantar fascial fibromatosis: Secondary | ICD-10-CM | POA: Diagnosis not present

## 2022-03-19 DIAGNOSIS — M24572 Contracture, left ankle: Secondary | ICD-10-CM | POA: Diagnosis not present

## 2022-03-19 DIAGNOSIS — M2012 Hallux valgus (acquired), left foot: Secondary | ICD-10-CM | POA: Diagnosis not present

## 2022-04-12 DIAGNOSIS — Z1331 Encounter for screening for depression: Secondary | ICD-10-CM | POA: Diagnosis not present

## 2022-04-12 DIAGNOSIS — I10 Essential (primary) hypertension: Secondary | ICD-10-CM | POA: Diagnosis not present

## 2022-04-12 DIAGNOSIS — E6609 Other obesity due to excess calories: Secondary | ICD-10-CM | POA: Diagnosis not present

## 2022-04-16 DIAGNOSIS — M24571 Contracture, right ankle: Secondary | ICD-10-CM | POA: Diagnosis not present

## 2022-04-16 DIAGNOSIS — M24572 Contracture, left ankle: Secondary | ICD-10-CM | POA: Diagnosis not present

## 2022-04-16 DIAGNOSIS — M722 Plantar fascial fibromatosis: Secondary | ICD-10-CM | POA: Diagnosis not present

## 2022-04-16 DIAGNOSIS — M7662 Achilles tendinitis, left leg: Secondary | ICD-10-CM | POA: Diagnosis not present

## 2022-04-16 DIAGNOSIS — M2011 Hallux valgus (acquired), right foot: Secondary | ICD-10-CM | POA: Diagnosis not present

## 2022-04-16 DIAGNOSIS — M2012 Hallux valgus (acquired), left foot: Secondary | ICD-10-CM | POA: Diagnosis not present

## 2022-04-16 DIAGNOSIS — M792 Neuralgia and neuritis, unspecified: Secondary | ICD-10-CM | POA: Diagnosis not present

## 2022-04-24 ENCOUNTER — Other Ambulatory Visit: Payer: Self-pay

## 2022-04-24 ENCOUNTER — Inpatient Hospital Stay: Payer: Medicare PPO | Attending: Adult Health

## 2022-04-24 DIAGNOSIS — D473 Essential (hemorrhagic) thrombocythemia: Secondary | ICD-10-CM | POA: Insufficient documentation

## 2022-04-24 LAB — CBC WITH DIFFERENTIAL (CANCER CENTER ONLY)
Abs Immature Granulocytes: 0.02 10*3/uL (ref 0.00–0.07)
Basophils Absolute: 0 10*3/uL (ref 0.0–0.1)
Basophils Relative: 1 %
Eosinophils Absolute: 0.1 10*3/uL (ref 0.0–0.5)
Eosinophils Relative: 2 %
HCT: 39.9 % (ref 36.0–46.0)
Hemoglobin: 13.8 g/dL (ref 12.0–15.0)
Immature Granulocytes: 0 %
Lymphocytes Relative: 32 %
Lymphs Abs: 2 10*3/uL (ref 0.7–4.0)
MCH: 35.4 pg — ABNORMAL HIGH (ref 26.0–34.0)
MCHC: 34.6 g/dL (ref 30.0–36.0)
MCV: 102.3 fL — ABNORMAL HIGH (ref 80.0–100.0)
Monocytes Absolute: 0.4 10*3/uL (ref 0.1–1.0)
Monocytes Relative: 6 %
Neutro Abs: 3.6 10*3/uL (ref 1.7–7.7)
Neutrophils Relative %: 59 %
Platelet Count: 493 10*3/uL — ABNORMAL HIGH (ref 150–400)
RBC: 3.9 MIL/uL (ref 3.87–5.11)
RDW: 12.3 % (ref 11.5–15.5)
WBC Count: 6.2 10*3/uL (ref 4.0–10.5)
nRBC: 0 % (ref 0.0–0.2)

## 2022-04-24 LAB — CMP (CANCER CENTER ONLY)
ALT: 11 U/L (ref 0–44)
AST: 16 U/L (ref 15–41)
Albumin: 3.7 g/dL (ref 3.5–5.0)
Alkaline Phosphatase: 82 U/L (ref 38–126)
Anion gap: 7 (ref 5–15)
BUN: 26 mg/dL — ABNORMAL HIGH (ref 8–23)
CO2: 29 mmol/L (ref 22–32)
Calcium: 10.1 mg/dL (ref 8.9–10.3)
Chloride: 106 mmol/L (ref 98–111)
Creatinine: 1.3 mg/dL — ABNORMAL HIGH (ref 0.44–1.00)
GFR, Estimated: 45 mL/min — ABNORMAL LOW (ref >60)
Glucose, Bld: 89 mg/dL (ref 70–99)
Potassium: 3.7 mmol/L (ref 3.5–5.1)
Sodium: 142 mmol/L (ref 135–145)
Total Bilirubin: 1.3 mg/dL — ABNORMAL HIGH (ref 0.3–1.2)
Total Protein: 6.5 g/dL (ref 6.5–8.1)

## 2022-04-25 ENCOUNTER — Telehealth: Payer: Self-pay | Admitting: *Deleted

## 2022-04-25 NOTE — Telephone Encounter (Signed)
TCT patient regarding recent lab results.  Spoke with her. Advised that her platelet count is up to 493. It was 408 3 months ago.  Pt confirmed that she is taking the hydroxyurea 2 x a day. Advised that I weill let Dr. Lorenso Courier know that she is taking her medications correctly. Pt voiced understanding.

## 2022-04-25 NOTE — Telephone Encounter (Signed)
-----   Message from Orson Slick, MD sent at 04/25/2022  9:04 AM EST ----- Please let Lisa Buckley know that her Plt are above target at 493 (target <400). Please assure she is taking her hydroxyurea twice daily as prescribed. If she is taking 2 per day please let me know.   ----- Message ----- From: Buel Ream, Lab In [place] Sent: 04/24/2022   2:45 PM EST To: Orson Slick, MD

## 2022-04-29 ENCOUNTER — Telehealth: Payer: Self-pay | Admitting: *Deleted

## 2022-04-29 NOTE — Telephone Encounter (Signed)
   TCT patient regarding dose adjustment for her Hydrea. Dr. Lorenso Courier recommends that she take 1 pill in the morning and 2 pills at night (500 mg in AM, 1000 mg in PM) in order to help lower her platelet count.  Advised that we will schedule a lab visit in 1 month's time in order to assess how this. Pt is agreeable to this plan.  Scheduling message sent for 05/30/22 late afternoon lab appt.

## 2022-05-15 DIAGNOSIS — M722 Plantar fascial fibromatosis: Secondary | ICD-10-CM | POA: Diagnosis not present

## 2022-05-15 DIAGNOSIS — M7662 Achilles tendinitis, left leg: Secondary | ICD-10-CM | POA: Diagnosis not present

## 2022-05-30 ENCOUNTER — Other Ambulatory Visit: Payer: Self-pay

## 2022-05-30 ENCOUNTER — Inpatient Hospital Stay: Payer: Medicare PPO | Attending: Adult Health

## 2022-05-30 ENCOUNTER — Other Ambulatory Visit: Payer: TRICARE For Life (TFL)

## 2022-05-30 DIAGNOSIS — Z87891 Personal history of nicotine dependence: Secondary | ICD-10-CM | POA: Diagnosis not present

## 2022-05-30 DIAGNOSIS — D473 Essential (hemorrhagic) thrombocythemia: Secondary | ICD-10-CM | POA: Diagnosis not present

## 2022-05-30 LAB — CBC WITH DIFFERENTIAL (CANCER CENTER ONLY)
Abs Immature Granulocytes: 0.01 10*3/uL (ref 0.00–0.07)
Basophils Absolute: 0 10*3/uL (ref 0.0–0.1)
Basophils Relative: 1 %
Eosinophils Absolute: 0 10*3/uL (ref 0.0–0.5)
Eosinophils Relative: 1 %
HCT: 38.8 % (ref 36.0–46.0)
Hemoglobin: 13.5 g/dL (ref 12.0–15.0)
Immature Granulocytes: 0 %
Lymphocytes Relative: 26 %
Lymphs Abs: 1 10*3/uL (ref 0.7–4.0)
MCH: 36.7 pg — ABNORMAL HIGH (ref 26.0–34.0)
MCHC: 34.8 g/dL (ref 30.0–36.0)
MCV: 105.4 fL — ABNORMAL HIGH (ref 80.0–100.0)
Monocytes Absolute: 0.3 10*3/uL (ref 0.1–1.0)
Monocytes Relative: 8 %
Neutro Abs: 2.3 10*3/uL (ref 1.7–7.7)
Neutrophils Relative %: 64 %
Platelet Count: 331 10*3/uL (ref 150–400)
RBC: 3.68 MIL/uL — ABNORMAL LOW (ref 3.87–5.11)
RDW: 13.4 % (ref 11.5–15.5)
WBC Count: 3.7 10*3/uL — ABNORMAL LOW (ref 4.0–10.5)
nRBC: 0 % (ref 0.0–0.2)

## 2022-05-30 LAB — CMP (CANCER CENTER ONLY)
ALT: 13 U/L (ref 0–44)
AST: 15 U/L (ref 15–41)
Albumin: 4 g/dL (ref 3.5–5.0)
Alkaline Phosphatase: 89 U/L (ref 38–126)
Anion gap: 4 — ABNORMAL LOW (ref 5–15)
BUN: 17 mg/dL (ref 8–23)
CO2: 30 mmol/L (ref 22–32)
Calcium: 10.5 mg/dL — ABNORMAL HIGH (ref 8.9–10.3)
Chloride: 107 mmol/L (ref 98–111)
Creatinine: 1.01 mg/dL — ABNORMAL HIGH (ref 0.44–1.00)
GFR, Estimated: >60 mL/min (ref >60)
Glucose, Bld: 84 mg/dL (ref 70–99)
Potassium: 4.2 mmol/L (ref 3.5–5.1)
Sodium: 141 mmol/L (ref 135–145)
Total Bilirubin: 2 mg/dL — ABNORMAL HIGH (ref 0.3–1.2)
Total Protein: 6.4 g/dL — ABNORMAL LOW (ref 6.5–8.1)

## 2022-06-07 DIAGNOSIS — K648 Other hemorrhoids: Secondary | ICD-10-CM | POA: Diagnosis not present

## 2022-06-07 DIAGNOSIS — Z86010 Personal history of colonic polyps: Secondary | ICD-10-CM | POA: Diagnosis not present

## 2022-06-07 DIAGNOSIS — K5730 Diverticulosis of large intestine without perforation or abscess without bleeding: Secondary | ICD-10-CM | POA: Diagnosis not present

## 2022-06-07 DIAGNOSIS — Z800 Family history of malignant neoplasm of digestive organs: Secondary | ICD-10-CM | POA: Diagnosis not present

## 2022-06-07 DIAGNOSIS — Z09 Encounter for follow-up examination after completed treatment for conditions other than malignant neoplasm: Secondary | ICD-10-CM | POA: Diagnosis not present

## 2022-06-12 DIAGNOSIS — M7662 Achilles tendinitis, left leg: Secondary | ICD-10-CM | POA: Diagnosis not present

## 2022-06-12 DIAGNOSIS — M722 Plantar fascial fibromatosis: Secondary | ICD-10-CM | POA: Diagnosis not present

## 2022-06-26 DIAGNOSIS — M25571 Pain in right ankle and joints of right foot: Secondary | ICD-10-CM | POA: Diagnosis not present

## 2022-07-25 ENCOUNTER — Inpatient Hospital Stay (HOSPITAL_BASED_OUTPATIENT_CLINIC_OR_DEPARTMENT_OTHER): Payer: Medicare PPO | Admitting: Hematology and Oncology

## 2022-07-25 ENCOUNTER — Other Ambulatory Visit: Payer: Self-pay

## 2022-07-25 ENCOUNTER — Inpatient Hospital Stay: Payer: Medicare PPO | Attending: Adult Health

## 2022-07-25 ENCOUNTER — Other Ambulatory Visit: Payer: Self-pay | Admitting: Hematology and Oncology

## 2022-07-25 VITALS — BP 140/65 | HR 82 | Temp 97.7°F | Resp 16 | Wt 211.8 lb

## 2022-07-25 DIAGNOSIS — I10 Essential (primary) hypertension: Secondary | ICD-10-CM | POA: Insufficient documentation

## 2022-07-25 DIAGNOSIS — Z800 Family history of malignant neoplasm of digestive organs: Secondary | ICD-10-CM | POA: Insufficient documentation

## 2022-07-25 DIAGNOSIS — Z90710 Acquired absence of both cervix and uterus: Secondary | ICD-10-CM | POA: Insufficient documentation

## 2022-07-25 DIAGNOSIS — D473 Essential (hemorrhagic) thrombocythemia: Secondary | ICD-10-CM | POA: Diagnosis not present

## 2022-07-25 DIAGNOSIS — Z87891 Personal history of nicotine dependence: Secondary | ICD-10-CM | POA: Insufficient documentation

## 2022-07-25 DIAGNOSIS — D75839 Thrombocytosis, unspecified: Secondary | ICD-10-CM | POA: Diagnosis not present

## 2022-07-25 LAB — CBC WITH DIFFERENTIAL (CANCER CENTER ONLY)
Abs Immature Granulocytes: 0.01 10*3/uL (ref 0.00–0.07)
Basophils Absolute: 0 10*3/uL (ref 0.0–0.1)
Basophils Relative: 1 %
Eosinophils Absolute: 0 10*3/uL (ref 0.0–0.5)
Eosinophils Relative: 1 %
HCT: 37.8 % (ref 36.0–46.0)
Hemoglobin: 13.4 g/dL (ref 12.0–15.0)
Immature Granulocytes: 0 %
Lymphocytes Relative: 19 %
Lymphs Abs: 0.9 10*3/uL (ref 0.7–4.0)
MCH: 38.4 pg — ABNORMAL HIGH (ref 26.0–34.0)
MCHC: 35.4 g/dL (ref 30.0–36.0)
MCV: 108.3 fL — ABNORMAL HIGH (ref 80.0–100.0)
Monocytes Absolute: 0.4 10*3/uL (ref 0.1–1.0)
Monocytes Relative: 8 %
Neutro Abs: 3.3 10*3/uL (ref 1.7–7.7)
Neutrophils Relative %: 71 %
Platelet Count: 333 10*3/uL (ref 150–400)
RBC: 3.49 MIL/uL — ABNORMAL LOW (ref 3.87–5.11)
RDW: 12.9 % (ref 11.5–15.5)
WBC Count: 4.6 10*3/uL (ref 4.0–10.5)
nRBC: 0 % (ref 0.0–0.2)

## 2022-07-25 LAB — CMP (CANCER CENTER ONLY)
ALT: 17 U/L (ref 0–44)
AST: 19 U/L (ref 15–41)
Albumin: 4.4 g/dL (ref 3.5–5.0)
Alkaline Phosphatase: 92 U/L (ref 38–126)
Anion gap: 6 (ref 5–15)
BUN: 16 mg/dL (ref 8–23)
CO2: 28 mmol/L (ref 22–32)
Calcium: 10.8 mg/dL — ABNORMAL HIGH (ref 8.9–10.3)
Chloride: 106 mmol/L (ref 98–111)
Creatinine: 1.01 mg/dL — ABNORMAL HIGH (ref 0.44–1.00)
GFR, Estimated: >60 mL/min (ref >60)
Glucose, Bld: 108 mg/dL — ABNORMAL HIGH (ref 70–99)
Potassium: 3.7 mmol/L (ref 3.5–5.1)
Sodium: 140 mmol/L (ref 135–145)
Total Bilirubin: 1.8 mg/dL — ABNORMAL HIGH (ref 0.3–1.2)
Total Protein: 7 g/dL (ref 6.5–8.1)

## 2022-07-25 NOTE — Progress Notes (Signed)
[place] [phone]) [phone]   Fax:(336) [phone]  PROGRESS NOTE  Patient Care Team: Donald Prose, MD as PCP - General (Family Medicine)  Hematological/Oncological History # Essential Thrombocytosis, JAK2 positive  08/23/2019: NGS confirms JAK2 V617F 09/09/2019: Bone marrow biopsy results consistent with a  myeloproliferative neoplasm particularly essential thrombocythemia 01/23/2021: last visit with Dr. Jana Buckley 01/23/2022: transition care to Dr. Lorenso Buckley  04/24/2022: increase to hydroxyurea 500 mg in AM and 1000 mg in PM 07/25/2022: white blood cell 4.6, hemoglobin 13.4, MCV 108.3, and platelets of 333  Interval History:  Lisa Buckley 67 y.o. female with medical history significant for essential thrombocytosis who presents for a follow up visit. The patient's last visit was on 01/23/2022. In the interim since the last visit she has continued on hydroxyurea therapy.   On exam today Lisa Buckley reports that she has been faithfully taking her hydroxyurea therapy in the interim since her last visit.  She reports that she typically gets the prescription refilled for about $30 per month.  She notes that she has not been having any trouble with nausea, vomiting, or diarrhea.  She reports that she has also been taking her baby aspirin without any bleeding, bruising, or dark stools.  She notes that she is not having any ulcers in the mouth or on the ankles.  She notes that she is also not had any changes in her medication in the interim since her last visit.  Overall she feels well and has no questions concerns or complaints today.  A full 10 point ROS was otherwise negative.  MEDICAL HISTORY:  Past Medical History:  Diagnosis Date   HTN (hypertension)    Seasonal allergies     SURGICAL HISTORY: Past Surgical History:  Procedure Laterality Date   ABDOMINAL HYSTERECTOMY  1992    SOCIAL HISTORY: Social History   Socioeconomic History   Marital status: Married     Spouse name: Not on file   Number of children: Not on file   Years of education: Not on file   Highest education level: Not on file  Occupational History   Not on file  Tobacco Use   Smoking status: Former   Smokeless tobacco: Never  Substance and Sexual Activity   Alcohol use: Yes    Comment: rarely   Drug use: Not on file   Sexual activity: Not on file  Other Topics Concern   Not on file  Social History Narrative   Not on file   Social Determinants of Health   Financial Resource Strain: Not on file  Food Insecurity: Not on file  Transportation Needs: Not on file  Physical Activity: Not on file  Stress: Not on file  Social Connections: Not on file  Intimate Partner Violence: Not on file    FAMILY HISTORY: Family History  Problem Relation Age of Onset   Hypertension Mother    Hypertension Father    Colon cancer Sister    Colon cancer Brother     ALLERGIES:  is allergic to morphine and related.  MEDICATIONS:  Current Outpatient Medications  Medication Sig Dispense Refill   esomeprazole (NEXIUM) 40 MG capsule Take 40 mg by mouth as needed.     hydroxyurea (HYDREA) 500 MG capsule Take 1 capsule (500 mg total) by mouth in the morning, at noon, and at bedtime. May take with food to minimize GI side effects. 90 capsule 3   naproxen sodium (ALEVE) 220 MG tablet Take 220 mg by mouth daily as needed.  telmisartan-hydrochlorothiazide (MICARDIS HCT) 80-25 MG tablet TK 1 T PO QD  1   No current facility-administered medications for this visit.    REVIEW OF SYSTEMS:   Constitutional: ( - ) fevers, ( - )  chills , ( - ) night sweats Eyes: ( - ) blurriness of vision, ( - ) double vision, ( - ) watery eyes Ears, nose, mouth, throat, and face: ( - ) mucositis, ( - ) sore throat Respiratory: ( - ) cough, ( - ) dyspnea, ( - ) wheezes Cardiovascular: ( - ) palpitation, ( - ) chest discomfort, ( - ) lower extremity swelling Gastrointestinal:  ( - ) nausea, ( - ) heartburn, ( -  ) change in bowel habits Skin: ( - ) abnormal skin rashes Lymphatics: ( - ) new lymphadenopathy, ( - ) easy bruising Neurological: ( - ) numbness, ( - ) tingling, ( - ) new weaknesses Behavioral/Psych: ( - ) mood change, ( - ) new changes  All other systems were reviewed with the patient and are negative.  PHYSICAL EXAMINATION:  Vitals:   07/25/22 1021  BP: (!) 140/65  Pulse: 82  Resp: 16  Temp: 97.7 F (36.5 C)  SpO2: 100%   Filed Weights   07/25/22 1021  Weight: 211 lb 12.8 oz (96.1 kg)    GENERAL: Well-appearing middle-aged African-American female, alert, no distress and comfortable SKIN: skin color, texture, turgor are normal, no rashes or significant lesions EYES: conjunctiva are pink and non-injected, sclera clear LUNGS: clear to auscultation and percussion with normal breathing effort HEART: regular rate & rhythm and no murmurs and no lower extremity edema Musculoskeletal: no cyanosis of digits and no clubbing  PSYCH: alert & oriented x 3, fluent speech NEURO: no focal motor/sensory deficits  LABORATORY DATA:  I have reviewed the data as listed    Latest Ref Rng & Units 07/25/2022    9:23 AM 05/30/2022    9:21 AM 04/24/2022    2:38 PM  CBC  WBC 4.0 - 10.5 K/uL 4.6  3.7  6.2   Hemoglobin 12.0 - 15.0 g/dL 13.4  13.5  13.8   Hematocrit 36.0 - 46.0 % 37.8  38.8  39.9   Platelets 150 - 400 K/uL 333  331  493        Latest Ref Rng & Units 07/25/2022    9:23 AM 05/30/2022    9:21 AM 04/24/2022    2:38 PM  CMP  Glucose 70 - 99 mg/dL 108  84  89   BUN 8 - 23 mg/dL 16  17  26    Creatinine 0.44 - 1.00 mg/dL 1.01  1.01  1.30   Sodium 135 - 145 mmol/L 140  141  142   Potassium 3.5 - 5.1 mmol/L 3.7  4.2  3.7   Chloride 98 - 111 mmol/L 106  107  106   CO2 22 - 32 mmol/L 28  30  29    Calcium 8.9 - 10.3 mg/dL 10.8  10.5  10.1   Total Protein 6.5 - 8.1 g/dL 7.0  6.4  6.5   Total Bilirubin 0.3 - 1.2 mg/dL 1.8  2.0  1.3   Alkaline Phos 38 - 126 U/L 92  89  82   AST 15 -  41 U/L 19  15  16    ALT 0 - 44 U/L 17  13  11     RADIOGRAPHIC STUDIES: No results found.  ASSESSMENT & PLAN Lisa Buckley 67 y.o. female with medical history  significant for essential thrombocytosis who presents for a follow up visit.   At this time Lisa Buckley's findings are consistent with a JAK2 positive essential thrombocytosis, confirmed by bone marrow biopsy.  She is currently on hydroxyurea therapy which is well-tolerated and controlling her platelets well.  Overall we will continue on this treatment plan as started by Dr. Jana Buckley.  # Essential Thrombocytosis, JAK2 positive  --continue hydroxyurea 500 mg in the AM and 1000 mg in the PM for cytoreductive therapy --start ASA 81 mg PO daily --labs today show white blood cell 4.6, hemoglobin 13.4, MCV 108.3, and platelets of 333 -- Return to clinic in 6 months time with an interval 21-month lab  No orders of the defined types were placed in this encounter.   All questions were answered. The patient knows to call the clinic with any problems, questions or concerns.  A total of more than 40 minutes were spent on this encounter with face-to-face time and non-face-to-face time, including preparing to see the patient, ordering tests and/or medications, counseling the patient and coordination of care as outlined above.   Ledell Peoples, MD Department of Hematology/Oncology [place] [facility] Phone: [phone] Pager: [phone] Email: [email or web address]  07/28/2022 5:10 PM

## 2022-07-28 MED ORDER — HYDROXYUREA 500 MG PO CAPS: 500.0000 mg | ORAL_CAPSULE | Freq: Three times a day (TID) | ORAL | 3 refills | Status: DC

## 2022-08-01 DIAGNOSIS — M722 Plantar fascial fibromatosis: Secondary | ICD-10-CM | POA: Diagnosis not present

## 2022-08-01 DIAGNOSIS — S90121A Contusion of right lesser toe(s) without damage to nail, initial encounter: Secondary | ICD-10-CM | POA: Diagnosis not present

## 2022-08-01 DIAGNOSIS — M2011 Hallux valgus (acquired), right foot: Secondary | ICD-10-CM | POA: Diagnosis not present

## 2022-08-01 DIAGNOSIS — M2012 Hallux valgus (acquired), left foot: Secondary | ICD-10-CM | POA: Diagnosis not present

## 2022-08-01 DIAGNOSIS — M7662 Achilles tendinitis, left leg: Secondary | ICD-10-CM | POA: Diagnosis not present

## 2022-10-16 DIAGNOSIS — M65311 Trigger thumb, right thumb: Secondary | ICD-10-CM | POA: Diagnosis not present

## 2022-10-24 ENCOUNTER — Other Ambulatory Visit: Payer: Self-pay

## 2022-10-24 ENCOUNTER — Other Ambulatory Visit: Payer: Self-pay | Admitting: Hematology and Oncology

## 2022-10-24 ENCOUNTER — Inpatient Hospital Stay: Payer: Medicare PPO | Attending: Adult Health

## 2022-10-24 DIAGNOSIS — D473 Essential (hemorrhagic) thrombocythemia: Secondary | ICD-10-CM

## 2022-10-24 LAB — CMP (CANCER CENTER ONLY)
ALT: 11 U/L (ref 0–44)
AST: 12 U/L — ABNORMAL LOW (ref 15–41)
Albumin: 3.7 g/dL (ref 3.5–5.0)
Alkaline Phosphatase: 80 U/L (ref 38–126)
Anion gap: 4 — ABNORMAL LOW (ref 5–15)
BUN: 18 mg/dL (ref 8–23)
CO2: 30 mmol/L (ref 22–32)
Calcium: 10.5 mg/dL — ABNORMAL HIGH (ref 8.9–10.3)
Chloride: 107 mmol/L (ref 98–111)
Creatinine: 0.87 mg/dL (ref 0.44–1.00)
GFR, Estimated: >60 mL/min (ref >60)
Glucose, Bld: 99 mg/dL (ref 70–99)
Potassium: 4.3 mmol/L (ref 3.5–5.1)
Sodium: 141 mmol/L (ref 135–145)
Total Bilirubin: 1.9 mg/dL — ABNORMAL HIGH (ref 0.3–1.2)
Total Protein: 6.5 g/dL (ref 6.5–8.1)

## 2022-10-24 LAB — CBC WITH DIFFERENTIAL (CANCER CENTER ONLY)
Abs Immature Granulocytes: 0 10*3/uL (ref 0.00–0.07)
Basophils Absolute: 0 10*3/uL (ref 0.0–0.1)
Basophils Relative: 1 %
Eosinophils Absolute: 0 10*3/uL (ref 0.0–0.5)
Eosinophils Relative: 1 %
HCT: 36.2 % (ref 36.0–46.0)
Hemoglobin: 12.6 g/dL (ref 12.0–15.0)
Immature Granulocytes: 0 %
Lymphocytes Relative: 33 %
Lymphs Abs: 1 10*3/uL (ref 0.7–4.0)
MCH: 39.1 pg — ABNORMAL HIGH (ref 26.0–34.0)
MCHC: 34.8 g/dL (ref 30.0–36.0)
MCV: 112.4 fL — ABNORMAL HIGH (ref 80.0–100.0)
Monocytes Absolute: 0.3 10*3/uL (ref 0.1–1.0)
Monocytes Relative: 10 %
Neutro Abs: 1.7 10*3/uL (ref 1.7–7.7)
Neutrophils Relative %: 55 %
Platelet Count: 251 10*3/uL (ref 150–400)
RBC: 3.22 MIL/uL — ABNORMAL LOW (ref 3.87–5.11)
RDW: 12.4 % (ref 11.5–15.5)
WBC Count: 3.1 10*3/uL — ABNORMAL LOW (ref 4.0–10.5)
nRBC: 0 % (ref 0.0–0.2)

## 2022-11-01 DIAGNOSIS — Z0000 Encounter for general adult medical examination without abnormal findings: Secondary | ICD-10-CM | POA: Diagnosis not present

## 2022-11-01 DIAGNOSIS — Z124 Encounter for screening for malignant neoplasm of cervix: Secondary | ICD-10-CM | POA: Diagnosis not present

## 2022-11-01 DIAGNOSIS — Z01411 Encounter for gynecological examination (general) (routine) with abnormal findings: Secondary | ICD-10-CM | POA: Diagnosis not present

## 2022-11-01 DIAGNOSIS — Z6836 Body mass index (BMI) 36.0-36.9, adult: Secondary | ICD-10-CM | POA: Diagnosis not present

## 2022-11-01 DIAGNOSIS — M8588 Other specified disorders of bone density and structure, other site: Secondary | ICD-10-CM | POA: Diagnosis not present

## 2022-11-01 DIAGNOSIS — Z1231 Encounter for screening mammogram for malignant neoplasm of breast: Secondary | ICD-10-CM | POA: Diagnosis not present

## 2022-11-01 DIAGNOSIS — M1711 Unilateral primary osteoarthritis, right knee: Secondary | ICD-10-CM | POA: Diagnosis not present

## 2022-11-01 DIAGNOSIS — Z01419 Encounter for gynecological examination (general) (routine) without abnormal findings: Secondary | ICD-10-CM | POA: Diagnosis not present

## 2022-11-01 DIAGNOSIS — Z1331 Encounter for screening for depression: Secondary | ICD-10-CM | POA: Diagnosis not present

## 2022-11-01 DIAGNOSIS — D473 Essential (hemorrhagic) thrombocythemia: Secondary | ICD-10-CM | POA: Diagnosis not present

## 2022-11-01 DIAGNOSIS — I10 Essential (primary) hypertension: Secondary | ICD-10-CM | POA: Diagnosis not present

## 2022-12-15 ENCOUNTER — Other Ambulatory Visit: Payer: Self-pay | Admitting: Hematology and Oncology

## 2022-12-17 DIAGNOSIS — M65311 Trigger thumb, right thumb: Secondary | ICD-10-CM | POA: Diagnosis not present

## 2022-12-23 DIAGNOSIS — M65311 Trigger thumb, right thumb: Secondary | ICD-10-CM | POA: Diagnosis not present

## 2023-01-23 ENCOUNTER — Other Ambulatory Visit: Payer: Self-pay | Admitting: Hematology and Oncology

## 2023-01-23 ENCOUNTER — Inpatient Hospital Stay: Payer: Medicare PPO | Attending: Adult Health

## 2023-01-23 ENCOUNTER — Inpatient Hospital Stay (HOSPITAL_BASED_OUTPATIENT_CLINIC_OR_DEPARTMENT_OTHER): Payer: Medicare PPO | Admitting: Hematology and Oncology

## 2023-01-23 VITALS — BP 146/75 | HR 73 | Temp 98.2°F | Resp 18 | Ht 65.0 in | Wt 217.3 lb

## 2023-01-23 DIAGNOSIS — D473 Essential (hemorrhagic) thrombocythemia: Secondary | ICD-10-CM

## 2023-01-23 DIAGNOSIS — D75839 Thrombocytosis, unspecified: Secondary | ICD-10-CM | POA: Diagnosis not present

## 2023-01-23 DIAGNOSIS — Z87891 Personal history of nicotine dependence: Secondary | ICD-10-CM | POA: Diagnosis not present

## 2023-01-23 DIAGNOSIS — Z800 Family history of malignant neoplasm of digestive organs: Secondary | ICD-10-CM | POA: Diagnosis not present

## 2023-01-23 LAB — CMP (CANCER CENTER ONLY)
ALT: 11 U/L (ref 0–44)
AST: 12 U/L — ABNORMAL LOW (ref 15–41)
Albumin: 4 g/dL (ref 3.5–5.0)
Alkaline Phosphatase: 82 U/L (ref 38–126)
Anion gap: 3 — ABNORMAL LOW (ref 5–15)
BUN: 18 mg/dL (ref 8–23)
CO2: 30 mmol/L (ref 22–32)
Calcium: 10.4 mg/dL — ABNORMAL HIGH (ref 8.9–10.3)
Chloride: 106 mmol/L (ref 98–111)
Creatinine: 0.99 mg/dL (ref 0.44–1.00)
GFR, Estimated: >60 mL/min (ref >60)
Glucose, Bld: 89 mg/dL (ref 70–99)
Potassium: 4.3 mmol/L (ref 3.5–5.1)
Sodium: 139 mmol/L (ref 135–145)
Total Bilirubin: 1.9 mg/dL — ABNORMAL HIGH (ref 0.3–1.2)
Total Protein: 6.5 g/dL (ref 6.5–8.1)

## 2023-01-23 LAB — CBC WITH DIFFERENTIAL (CANCER CENTER ONLY)
Abs Immature Granulocytes: 0.01 10*3/uL (ref 0.00–0.07)
Basophils Absolute: 0 10*3/uL (ref 0.0–0.1)
Basophils Relative: 1 %
Eosinophils Absolute: 0 10*3/uL (ref 0.0–0.5)
Eosinophils Relative: 1 %
HCT: 36.5 % (ref 36.0–46.0)
Hemoglobin: 12.6 g/dL (ref 12.0–15.0)
Immature Granulocytes: 0 %
Lymphocytes Relative: 25 %
Lymphs Abs: 0.8 10*3/uL (ref 0.7–4.0)
MCH: 39.1 pg — ABNORMAL HIGH (ref 26.0–34.0)
MCHC: 34.5 g/dL (ref 30.0–36.0)
MCV: 113.4 fL — ABNORMAL HIGH (ref 80.0–100.0)
Monocytes Absolute: 0.2 10*3/uL (ref 0.1–1.0)
Monocytes Relative: 7 %
Neutro Abs: 2.2 10*3/uL (ref 1.7–7.7)
Neutrophils Relative %: 66 %
Platelet Count: 219 10*3/uL (ref 150–400)
RBC: 3.22 MIL/uL — ABNORMAL LOW (ref 3.87–5.11)
RDW: 12.1 % (ref 11.5–15.5)
WBC Count: 3.3 10*3/uL — ABNORMAL LOW (ref 4.0–10.5)
nRBC: 0 % (ref 0.0–0.2)

## 2023-01-23 MED ORDER — HYDROXYUREA 500 MG PO CAPS: 500.0000 mg | ORAL_CAPSULE | Freq: Three times a day (TID) | ORAL | 2 refills | Status: DC

## 2023-01-23 NOTE — Progress Notes (Signed)
[facility] Telephone:(336) [phone]   Fax:(336) [phone]  PROGRESS NOTE  Patient Care Team: Deatra James, MD as PCP - General (Family Medicine)  Hematological/Oncological History # Essential Thrombocytosis, JAK2 positive  08/23/2019: NGS confirms JAK2 V617F 09/09/2019: Bone marrow biopsy results consistent with a  myeloproliferative neoplasm particularly essential thrombocythemia 01/23/2021: last visit with Dr. Darnelle Catalan 01/23/2022: transition care to Dr. Leonides Schanz  04/24/2022: increase to hydroxyurea 500 mg in AM and 1000 mg in PM 07/25/2022: white blood cell 4.6, hemoglobin 13.4, MCV 108.3, and platelets of 333  Interval History:  Lisa Buckley 67 y.o. female with medical history significant for essential thrombocytosis who presents for a follow up visit. The patient's last visit was on 07/25/2022. In the interim since the last visit she has continued on hydroxyurea therapy.   On exam today Lisa Buckley reports she has been fine overall interim since her last visit though she is disconcerted about the fact she has gained weight.  She is up 6 pounds from our last visit in March 2024.  She knows she is unsure how she works out and is trying to eat well..  She reports that she was running low on her hydroxyurea and was unable to take 3 pills a day, however dropped down to 2 pills/day.  She has not been having any trouble with nausea, vomiting, or diarrhea.  She denies any ulcers on her mouth or on her ankles.  She reports she did have a thumb surgery approximate 1 month ago and has been recovering well from that.  She notes she would like to get her weight down to 200 pounds, and eventually down to 180 pounds.  Denies any recent infectious symptoms such as fevers, chills, sweats, nausea, vomiting or diarrhea.  Overall she feels well and has no questions concerns or complaints today.  A full 10 point ROS was otherwise negative.  MEDICAL HISTORY:  Past Medical History:  Diagnosis Date    HTN (hypertension)    Seasonal allergies     SURGICAL HISTORY: Past Surgical History:  Procedure Laterality Date   ABDOMINAL HYSTERECTOMY  1992    SOCIAL HISTORY: Social History   Socioeconomic History   Marital status: Married    Spouse name: Not on file   Number of children: Not on file   Years of education: Not on file   Highest education level: Not on file  Occupational History   Not on file  Tobacco Use   Smoking status: Former   Smokeless tobacco: Never  Substance and Sexual Activity   Alcohol use: Yes    Comment: rarely   Drug use: Not on file   Sexual activity: Not on file  Other Topics Concern   Not on file  Social History Narrative   Not on file   Social Determinants of Health   Financial Resource Strain: Not on file  Food Insecurity: Not on file  Transportation Needs: Not on file  Physical Activity: Not on file  Stress: Not on file  Social Connections: Not on file  Intimate Partner Violence: Not on file    FAMILY HISTORY: Family History  Problem Relation Age of Onset   Hypertension Mother    Hypertension Father    Colon cancer Sister    Colon cancer Brother     ALLERGIES:  is allergic to morphine and codeine.  MEDICATIONS:  Current Outpatient Medications  Medication Sig Dispense Refill   hydroxyurea (HYDREA) 500 MG capsule Take 1 capsule (500 mg total) by mouth  in the morning, at noon, and at bedtime. May take with food to minimize GI side effects. 270 capsule 2   esomeprazole (NEXIUM) 40 MG capsule Take 40 mg by mouth as needed.     naproxen sodium (ALEVE) 220 MG tablet Take 220 mg by mouth daily as needed.     telmisartan-hydrochlorothiazide (MICARDIS HCT) 80-25 MG tablet TK 1 T PO QD  1   No current facility-administered medications for this visit.    REVIEW OF SYSTEMS:   Constitutional: ( - ) fevers, ( - )  chills , ( - ) night sweats Eyes: ( - ) blurriness of vision, ( - ) double vision, ( - ) watery eyes Ears, nose, mouth,  throat, and face: ( - ) mucositis, ( - ) sore throat Respiratory: ( - ) cough, ( - ) dyspnea, ( - ) wheezes Cardiovascular: ( - ) palpitation, ( - ) chest discomfort, ( - ) lower extremity swelling Gastrointestinal:  ( - ) nausea, ( - ) heartburn, ( - ) change in bowel habits Skin: ( - ) abnormal skin rashes Lymphatics: ( - ) new lymphadenopathy, ( - ) easy bruising Neurological: ( - ) numbness, ( - ) tingling, ( - ) new weaknesses Behavioral/Psych: ( - ) mood change, ( - ) new changes  All other systems were reviewed with the patient and are negative.  PHYSICAL EXAMINATION:  Vitals:   01/23/23 0946  BP: (!) 146/75  Pulse: 73  Resp: 18  Temp: 98.2 F (36.8 C)  SpO2: 100%    Filed Weights   01/23/23 0946  Weight: 217 lb 4.8 oz (98.6 kg)     GENERAL: Well-appearing middle-aged African-American female, alert, no distress and comfortable SKIN: skin color, texture, turgor are normal, no rashes or significant lesions EYES: conjunctiva are pink and non-injected, sclera clear LUNGS: clear to auscultation and percussion with normal breathing effort HEART: regular rate & rhythm and no murmurs and no lower extremity edema Musculoskeletal: no cyanosis of digits and no clubbing  PSYCH: alert & oriented x 3, fluent speech NEURO: no focal motor/sensory deficits  LABORATORY DATA:  I have reviewed the data as listed    Latest Ref Rng & Units 01/23/2023    9:15 AM 10/24/2022    8:47 AM 07/25/2022    9:23 AM  CBC  WBC 4.0 - 10.5 K/uL 3.3  3.1  4.6   Hemoglobin 12.0 - 15.0 g/dL 28.4  13.2  44.0   Hematocrit 36.0 - 46.0 % 36.5  36.2  37.8   Platelets 150 - 400 K/uL 219  251  333        Latest Ref Rng & Units 01/23/2023    9:15 AM 10/24/2022    8:47 AM 07/25/2022    9:23 AM  CMP  Glucose 70 - 99 mg/dL 89  99  102   BUN 8 - 23 mg/dL 18  18  16    Creatinine 0.44 - 1.00 mg/dL 7.25  3.66  4.40   Sodium 135 - 145 mmol/L 139  141  140   Potassium 3.5 - 5.1 mmol/L 4.3  4.3  3.7   Chloride  98 - 111 mmol/L 106  107  106   CO2 22 - 32 mmol/L 30  30  28    Calcium 8.9 - 10.3 mg/dL 34.7  42.5  95.6   Total Protein 6.5 - 8.1 g/dL 6.5  6.5  7.0   Total Bilirubin 0.3 - 1.2 mg/dL 1.9  1.9  1.8  Alkaline Phos 38 - 126 U/L 82  80  92   AST 15 - 41 U/L 12  12  19    ALT 0 - 44 U/L 11  11  17     RADIOGRAPHIC STUDIES: No results found.  ASSESSMENT & PLAN Lisa Buckley 67 y.o. female with medical history significant for essential thrombocytosis who presents for a follow up visit.   At this time Mrs. Tenbrink's findings are consistent with a JAK2 positive essential thrombocytosis, confirmed by bone marrow biopsy.  She is currently on hydroxyurea therapy which is well-tolerated and controlling her platelets well.  Overall we will continue on this treatment plan as started by Dr. Darnelle Catalan.  # Essential Thrombocytosis, JAK2 positive  --continue hydroxyurea 500 mg in the AM and 1000 mg in the PM for cytoreductive therapy --continue ASA 81 mg PO daily --labs today show white blood cell 3.3, hemoglobin 12.6, MCV 113.4, and platelets of 219 -- Return to clinic in 6 months time with an interval 74-month lab  No orders of the defined types were placed in this encounter.   All questions were answered. The patient knows to call the clinic with any problems, questions or concerns.  A total of more than 30 minutes were spent on this encounter with face-to-face time and non-face-to-face time, including preparing to see the patient, ordering tests and/or medications, counseling the patient and coordination of care as outlined above.   Ulysees Barns, MD Department of Hematology/Oncology [facility] Phone: [phone] Pager: [phone] Email: [email or web address]  01/23/2023 10:20 AM

## 2023-04-24 ENCOUNTER — Inpatient Hospital Stay: Payer: Medicare PPO | Attending: Adult Health

## 2023-04-24 ENCOUNTER — Other Ambulatory Visit: Payer: Self-pay | Admitting: Hematology and Oncology

## 2023-04-24 DIAGNOSIS — D75839 Thrombocytosis, unspecified: Secondary | ICD-10-CM | POA: Insufficient documentation

## 2023-04-24 DIAGNOSIS — D473 Essential (hemorrhagic) thrombocythemia: Secondary | ICD-10-CM

## 2023-04-24 LAB — CBC WITH DIFFERENTIAL (CANCER CENTER ONLY)
Abs Immature Granulocytes: 0.01 10*3/uL (ref 0.00–0.07)
Basophils Absolute: 0 10*3/uL (ref 0.0–0.1)
Basophils Relative: 1 %
Eosinophils Absolute: 0 10*3/uL (ref 0.0–0.5)
Eosinophils Relative: 1 %
HCT: 36.2 % (ref 36.0–46.0)
Hemoglobin: 12.6 g/dL (ref 12.0–15.0)
Immature Granulocytes: 0 %
Lymphocytes Relative: 39 %
Lymphs Abs: 1.3 10*3/uL (ref 0.7–4.0)
MCH: 38.5 pg — ABNORMAL HIGH (ref 26.0–34.0)
MCHC: 34.8 g/dL (ref 30.0–36.0)
MCV: 110.7 fL — ABNORMAL HIGH (ref 80.0–100.0)
Monocytes Absolute: 0.3 10*3/uL (ref 0.1–1.0)
Monocytes Relative: 8 %
Neutro Abs: 1.7 10*3/uL (ref 1.7–7.7)
Neutrophils Relative %: 51 %
Platelet Count: 286 10*3/uL (ref 150–400)
RBC: 3.27 MIL/uL — ABNORMAL LOW (ref 3.87–5.11)
RDW: 11.9 % (ref 11.5–15.5)
WBC Count: 3.3 10*3/uL — ABNORMAL LOW (ref 4.0–10.5)
nRBC: 0 % (ref 0.0–0.2)

## 2023-04-24 LAB — CMP (CANCER CENTER ONLY)
ALT: 13 U/L (ref 0–44)
AST: 17 U/L (ref 15–41)
Albumin: 4 g/dL (ref 3.5–5.0)
Alkaline Phosphatase: 87 U/L (ref 38–126)
Anion gap: 4 — ABNORMAL LOW (ref 5–15)
BUN: 19 mg/dL (ref 8–23)
CO2: 29 mmol/L (ref 22–32)
Calcium: 10.3 mg/dL (ref 8.9–10.3)
Chloride: 107 mmol/L (ref 98–111)
Creatinine: 0.91 mg/dL (ref 0.44–1.00)
GFR, Estimated: >60 mL/min (ref >60)
Glucose, Bld: 95 mg/dL (ref 70–99)
Potassium: 4.1 mmol/L (ref 3.5–5.1)
Sodium: 140 mmol/L (ref 135–145)
Total Bilirubin: 1.6 mg/dL — ABNORMAL HIGH (ref <1.2)
Total Protein: 6.5 g/dL (ref 6.5–8.1)

## 2023-05-02 DIAGNOSIS — E6609 Other obesity due to excess calories: Secondary | ICD-10-CM | POA: Diagnosis not present

## 2023-05-02 DIAGNOSIS — I10 Essential (primary) hypertension: Secondary | ICD-10-CM | POA: Diagnosis not present

## 2023-06-13 DIAGNOSIS — Z6836 Body mass index (BMI) 36.0-36.9, adult: Secondary | ICD-10-CM | POA: Diagnosis not present

## 2023-06-13 DIAGNOSIS — E6609 Other obesity due to excess calories: Secondary | ICD-10-CM | POA: Diagnosis not present

## 2023-06-20 ENCOUNTER — Other Ambulatory Visit: Payer: Self-pay | Admitting: Cardiology

## 2023-06-21 LAB — LIPID PANEL W/O CHOL/HDL RATIO
Cholesterol, Total: 164 mg/dL (ref 100–199)
HDL: 61 mg/dL (ref >39)
LDL Chol Calc (NIH): 85 mg/dL (ref 0–99)
Triglycerides: 99 mg/dL (ref 0–149)
VLDL Cholesterol Cal: 18 mg/dL (ref 5–40)

## 2023-07-16 NOTE — Progress Notes (Addendum)
 Patient attended screening event on 04/18/2024. At the screening event pt BP was 134/73. At the event pt noted her PCP was Deatra James, MD. Pt documented Medicare as insurance coverage. Pt documented no tobacco use and pt do not live with someone who smokes. Pt did not note SDOH needs at event.   Chart review confirms pt PCP is Deatra James, MD at [facility]. Pt insurance coverage is Medicare/T[employer]are/[employer]. Post event initial f/u note, Pt last office visit with PCP was on 05/02/2023 at [facility].  Hypertension is on pt problem list that's being addressed by PCP.   CHW called PCP office at [facility] family practice to inquire about upcoming appts for pt.  [facility] did confirm pt has an appt coming up in December 05, 2023. Pt is currently taking Telmisartan-hydrochlorothiazide to manage BP.   Chart review also indicates pt was referred to see Jaci Standard, MD for Oncology at [facility] by PCP Deatra James, MD. Pt has a future appts with Jaci Standard, MD on 07/24/2023.  No additional Health equity team support indicated at this time.

## 2023-07-21 ENCOUNTER — Encounter: Payer: Self-pay | Admitting: Cardiology

## 2023-07-24 ENCOUNTER — Other Ambulatory Visit: Payer: Self-pay | Admitting: Hematology and Oncology

## 2023-07-24 ENCOUNTER — Inpatient Hospital Stay: Payer: TRICARE For Life (TFL) | Attending: Adult Health

## 2023-07-24 ENCOUNTER — Inpatient Hospital Stay (HOSPITAL_BASED_OUTPATIENT_CLINIC_OR_DEPARTMENT_OTHER): Payer: TRICARE For Life (TFL) | Admitting: Hematology and Oncology

## 2023-07-24 VITALS — BP 135/60 | HR 69 | Temp 97.4°F | Resp 14 | Wt 211.8 lb

## 2023-07-24 DIAGNOSIS — Z79899 Other long term (current) drug therapy: Secondary | ICD-10-CM | POA: Diagnosis not present

## 2023-07-24 DIAGNOSIS — Z87891 Personal history of nicotine dependence: Secondary | ICD-10-CM | POA: Diagnosis not present

## 2023-07-24 DIAGNOSIS — D473 Essential (hemorrhagic) thrombocythemia: Secondary | ICD-10-CM | POA: Insufficient documentation

## 2023-07-24 DIAGNOSIS — Z800 Family history of malignant neoplasm of digestive organs: Secondary | ICD-10-CM | POA: Diagnosis not present

## 2023-07-24 DIAGNOSIS — D75839 Thrombocytosis, unspecified: Secondary | ICD-10-CM | POA: Diagnosis not present

## 2023-07-24 LAB — CBC WITH DIFFERENTIAL (CANCER CENTER ONLY)
Abs Immature Granulocytes: 0 10*3/uL (ref 0.00–0.07)
Basophils Absolute: 0 10*3/uL (ref 0.0–0.1)
Basophils Relative: 1 %
Eosinophils Absolute: 0 10*3/uL (ref 0.0–0.5)
Eosinophils Relative: 1 %
HCT: 36.8 % (ref 36.0–46.0)
Hemoglobin: 12.7 g/dL (ref 12.0–15.0)
Immature Granulocytes: 0 %
Lymphocytes Relative: 34 %
Lymphs Abs: 1 10*3/uL (ref 0.7–4.0)
MCH: 37.9 pg — ABNORMAL HIGH (ref 26.0–34.0)
MCHC: 34.5 g/dL (ref 30.0–36.0)
MCV: 109.9 fL — ABNORMAL HIGH (ref 80.0–100.0)
Monocytes Absolute: 0.3 10*3/uL (ref 0.1–1.0)
Monocytes Relative: 9 %
Neutro Abs: 1.7 10*3/uL (ref 1.7–7.7)
Neutrophils Relative %: 55 %
Platelet Count: 239 10*3/uL (ref 150–400)
RBC: 3.35 MIL/uL — ABNORMAL LOW (ref 3.87–5.11)
RDW: 13 % (ref 11.5–15.5)
WBC Count: 3 10*3/uL — ABNORMAL LOW (ref 4.0–10.5)
nRBC: 0 % (ref 0.0–0.2)

## 2023-07-24 LAB — CMP (CANCER CENTER ONLY)
ALT: 16 U/L (ref 0–44)
AST: 16 U/L (ref 15–41)
Albumin: 4 g/dL (ref 3.5–5.0)
Alkaline Phosphatase: 85 U/L (ref 38–126)
Anion gap: 1 — ABNORMAL LOW (ref 5–15)
BUN: 15 mg/dL (ref 8–23)
CO2: 31 mmol/L (ref 22–32)
Calcium: 9.9 mg/dL (ref 8.9–10.3)
Chloride: 108 mmol/L (ref 98–111)
Creatinine: 0.98 mg/dL (ref 0.44–1.00)
GFR, Estimated: >60 mL/min (ref >60)
Glucose, Bld: 98 mg/dL (ref 70–99)
Potassium: 4.4 mmol/L (ref 3.5–5.1)
Sodium: 140 mmol/L (ref 135–145)
Total Bilirubin: 1.4 mg/dL — ABNORMAL HIGH (ref 0.0–1.2)
Total Protein: 6.5 g/dL (ref 6.5–8.1)

## 2023-07-24 MED ORDER — HYDROXYUREA 500 MG PO CAPS: 500.0000 mg | ORAL_CAPSULE | Freq: Three times a day (TID) | ORAL | 2 refills | Status: DC

## 2023-07-24 NOTE — Progress Notes (Signed)
 [facility] [phone]   Fax:(336) [phone]  PROGRESS NOTE  Patient Care Team: Deatra James, MD as PCP - General (Family Medicine)  Hematological/Oncological History # Essential Thrombocytosis, JAK2 positive  08/23/2019: NGS confirms JAK2 V617F 09/09/2019: Bone marrow biopsy results consistent with a  myeloproliferative neoplasm particularly essential thrombocythemia 01/23/2021: last visit with Dr. Darnelle Catalan 01/23/2022: transition care to Dr. Leonides Schanz  04/24/2022: increase to hydroxyurea 500 mg in AM and 1000 mg in PM 07/25/2022: white blood cell 4.6, hemoglobin 13.4, MCV 108.3, and platelets of 333  Interval History:  Lisa Buckley 68 y.o. female with medical history significant for essential thrombocytosis who presents for a follow up visit. The patient's last visit was on 07/25/2022. In the interim since the last visit she has continued on hydroxyurea therapy.   On exam today Lisa Buckley reports she has been well overall in the interim since her last visit 6 months ago.  Her weight has remained steady at 211 pounds, but she would like to be 185 pounds.  She reports that she started a new workout with strength training.  She also met with a nutritionist.  She has had no major changes in her health in the interim since her last visit other than a surgery on her thumb.  She reports she is tolerating the aspirin and hydroxyurea well with no bleeding, bruising, or dark stools.  She denies any nausea, vomiting, or diarrhea.  Overall she feels well and is willing and able to continue on this treatment at this time.  She does not have any signs or symptoms concerning for VTE.Marland Kitchen  Denies any recent infectious symptoms such as fevers, chills, sweats, nausea, vomiting or diarrhea.  Overall she feels well and has no questions concerns or complaints today.  A full 10 point ROS was otherwise negative.  MEDICAL HISTORY:  Past Medical History:  Diagnosis Date   HTN (hypertension)     Seasonal allergies     SURGICAL HISTORY: Past Surgical History:  Procedure Laterality Date   ABDOMINAL HYSTERECTOMY  1992    SOCIAL HISTORY: Social History   Socioeconomic History   Marital status: Married    Spouse name: Not on file   Number of children: Not on file   Years of education: Not on file   Highest education level: Not on file  Occupational History   Not on file  Tobacco Use   Smoking status: Former   Smokeless tobacco: Never  Substance and Sexual Activity   Alcohol use: Yes    Comment: rarely   Drug use: Not on file   Sexual activity: Not on file  Other Topics Concern   Not on file  Social History Narrative   Not on file   Social Drivers of Health   Financial Resource Strain: Not on file  Food Insecurity: No Food Insecurity (06/20/2023)   Hunger Vital Sign    Worried About Running Out of Food in the Last Year: Never true    Ran Out of Food in the Last Year: Never true  Transportation Needs: No Transportation Needs (06/20/2023)   PRAPARE - Administrator, Civil Service (Medical): No    Lack of Transportation (Non-Medical): No  Physical Activity: Not on file  Stress: Not on file  Social Connections: Not on file  Intimate Partner Violence: Not At Risk (06/20/2023)   Humiliation, Afraid, Rape, and Kick questionnaire    Fear of Current or Ex-Partner: No    Emotionally Abused: No  Physically Abused: No    Sexually Abused: No    FAMILY HISTORY: Family History  Problem Relation Age of Onset   Hypertension Mother    Hypertension Father    Colon cancer Sister    Colon cancer Brother     ALLERGIES:  is allergic to morphine and codeine.  MEDICATIONS:  Current Outpatient Medications  Medication Sig Dispense Refill   esomeprazole (NEXIUM) 40 MG capsule Take 40 mg by mouth as needed.     hydroxyurea (HYDREA) 500 MG capsule Take 1 capsule (500 mg total) by mouth in the morning, at noon, and at bedtime. May take with food to minimize GI side  effects. 270 capsule 2   naproxen sodium (ALEVE) 220 MG tablet Take 220 mg by mouth daily as needed.     telmisartan-hydrochlorothiazide (MICARDIS HCT) 80-25 MG tablet TK 1 T PO QD  1   No current facility-administered medications for this visit.    REVIEW OF SYSTEMS:   Constitutional: ( - ) fevers, ( - )  chills , ( - ) night sweats Eyes: ( - ) blurriness of vision, ( - ) double vision, ( - ) watery eyes Ears, nose, mouth, throat, and face: ( - ) mucositis, ( - ) sore throat Respiratory: ( - ) cough, ( - ) dyspnea, ( - ) wheezes Cardiovascular: ( - ) palpitation, ( - ) chest discomfort, ( - ) lower extremity swelling Gastrointestinal:  ( - ) nausea, ( - ) heartburn, ( - ) change in bowel habits Skin: ( - ) abnormal skin rashes Lymphatics: ( - ) new lymphadenopathy, ( - ) easy bruising Neurological: ( - ) numbness, ( - ) tingling, ( - ) new weaknesses Behavioral/Psych: ( - ) mood change, ( - ) new changes  All other systems were reviewed with the patient and are negative.  PHYSICAL EXAMINATION:  Vitals:   07/24/23 1024  BP: 135/60  Pulse: 69  Resp: 14  Temp: (!) 97.4 F (36.3 C)  SpO2: 100%   Filed Weights   07/24/23 1024  Weight: 211 lb 12.8 oz (96.1 kg)    GENERAL: Well-appearing middle-aged African-American female, alert, no distress and comfortable SKIN: skin color, texture, turgor are normal, no rashes or significant lesions EYES: conjunctiva are pink and non-injected, sclera clear LUNGS: clear to auscultation and percussion with normal breathing effort HEART: regular rate & rhythm and no murmurs and no lower extremity edema Musculoskeletal: no cyanosis of digits and no clubbing  PSYCH: alert & oriented x 3, fluent speech NEURO: no focal motor/sensory deficits  LABORATORY DATA:  I have reviewed the data as listed    Latest Ref Rng & Units 07/24/2023   10:06 AM 04/24/2023    8:52 AM 01/23/2023    9:15 AM  CBC  WBC 4.0 - 10.5 K/uL 3.0  3.3  3.3   Hemoglobin 12.0  - 15.0 g/dL 91.4  78.2  95.6   Hematocrit 36.0 - 46.0 % 36.8  36.2  36.5   Platelets 150 - 400 K/uL 239  286  219        Latest Ref Rng & Units 07/24/2023   10:06 AM 04/24/2023    8:52 AM 01/23/2023    9:15 AM  CMP  Glucose 70 - 99 mg/dL 98  95  89   BUN 8 - 23 mg/dL 15  19  18    Creatinine 0.44 - 1.00 mg/dL 2.13  0.86  5.78   Sodium 135 - 145 mmol/L 140  140  139   Potassium 3.5 - 5.1 mmol/L 4.4  4.1  4.3   Chloride 98 - 111 mmol/L 108  107  106   CO2 22 - 32 mmol/L 31  29  30    Calcium 8.9 - 10.3 mg/dL 9.9  16.1  09.6   Total Protein 6.5 - 8.1 g/dL 6.5  6.5  6.5   Total Bilirubin 0.0 - 1.2 mg/dL 1.4  1.6  1.9   Alkaline Phos 38 - 126 U/L 85  87  82   AST 15 - 41 U/L 16  17  12    ALT 0 - 44 U/L 16  13  11     RADIOGRAPHIC STUDIES: No results found.  ASSESSMENT & PLAN Lisa Buckley 68 y.o. female with medical history significant for essential thrombocytosis who presents for a follow up visit.   At this time Mrs. Shin's findings are consistent with a JAK2 positive essential thrombocytosis, confirmed by bone marrow biopsy.  She is currently on hydroxyurea therapy which is well-tolerated and controlling her platelets well.  Overall we will continue on this treatment plan as started by Dr. Darnelle Catalan.  # Essential Thrombocytosis, JAK2 positive  --continue hydroxyurea 500 mg in the AM and 1000 mg in the PM for cytoreductive therapy --continue ASA 81 mg PO daily --labs today show white blood cell 3.0, Hgb 12.7, MCV 109.9, Plt 239 -- Return to clinic in 6 months time with an interval 52-month lab  No orders of the defined types were placed in this encounter.   All questions were answered. The patient knows to call the clinic with any problems, questions or concerns.  A total of more than 30 minutes were spent on this encounter with face-to-face time and non-face-to-face time, including preparing to see the patient, ordering tests and/or medications, counseling the patient and  coordination of care as outlined above.   Ulysees Barns, MD Department of Hematology/Oncology [facility] at [facility] Phone: [phone] Pager: [phone] Email: [email or web address]  07/24/2023 3:52 PM

## 2023-08-21 ENCOUNTER — Telehealth: Payer: Self-pay | Admitting: *Deleted

## 2023-08-21 DIAGNOSIS — M25561 Pain in right knee: Secondary | ICD-10-CM | POA: Diagnosis not present

## 2023-08-21 DIAGNOSIS — M25562 Pain in left knee: Secondary | ICD-10-CM | POA: Diagnosis not present

## 2023-08-21 MED ORDER — ASPIRIN 81 MG PO TBEC: 81.0000 mg | DELAYED_RELEASE_TABLET | Freq: Every day | ORAL | Status: AC

## 2023-08-21 NOTE — Telephone Encounter (Signed)
 Received vm message from pt requesting call back. TCT patient. She states she saw her orthopedist today and got an injection in her knee for arthritic pain. He wants to start her on Meloxicam. She is asking if it is ok for her to take the meloxicam. Dr. Leonides Schanz advises to only take meloxicam and not in addition to Aleve.  Pt is on ASA 81 mg, Hydroxyurea for ET and should not be on any other NSAIDs other than listed above and no other anticoagulants. Pt voiced understanding. Med list updated

## 2023-08-28 DIAGNOSIS — H40013 Open angle with borderline findings, low risk, bilateral: Secondary | ICD-10-CM | POA: Diagnosis not present

## 2023-08-28 DIAGNOSIS — H25813 Combined forms of age-related cataract, bilateral: Secondary | ICD-10-CM | POA: Diagnosis not present

## 2023-10-24 ENCOUNTER — Inpatient Hospital Stay: Attending: Adult Health

## 2023-10-24 DIAGNOSIS — D75839 Thrombocytosis, unspecified: Secondary | ICD-10-CM | POA: Insufficient documentation

## 2023-10-24 DIAGNOSIS — D473 Essential (hemorrhagic) thrombocythemia: Secondary | ICD-10-CM | POA: Insufficient documentation

## 2023-10-24 LAB — CBC WITH DIFFERENTIAL (CANCER CENTER ONLY)
Abs Immature Granulocytes: 0.01 10*3/uL (ref 0.00–0.07)
Basophils Absolute: 0 10*3/uL (ref 0.0–0.1)
Basophils Relative: 1 %
Eosinophils Absolute: 0 10*3/uL (ref 0.0–0.5)
Eosinophils Relative: 1 %
HCT: 35.8 % — ABNORMAL LOW (ref 36.0–46.0)
Hemoglobin: 12.7 g/dL (ref 12.0–15.0)
Immature Granulocytes: 0 %
Lymphocytes Relative: 31 %
Lymphs Abs: 1 10*3/uL (ref 0.7–4.0)
MCH: 39.4 pg — ABNORMAL HIGH (ref 26.0–34.0)
MCHC: 35.5 g/dL (ref 30.0–36.0)
MCV: 111.2 fL — ABNORMAL HIGH (ref 80.0–100.0)
Monocytes Absolute: 0.2 10*3/uL (ref 0.1–1.0)
Monocytes Relative: 8 %
Neutro Abs: 1.8 10*3/uL (ref 1.7–7.7)
Neutrophils Relative %: 59 %
Platelet Count: 248 10*3/uL (ref 150–400)
RBC: 3.22 MIL/uL — ABNORMAL LOW (ref 3.87–5.11)
RDW: 11.9 % (ref 11.5–15.5)
WBC Count: 3.1 10*3/uL — ABNORMAL LOW (ref 4.0–10.5)
nRBC: 0 % (ref 0.0–0.2)

## 2023-10-24 LAB — CMP (CANCER CENTER ONLY)
ALT: 17 U/L (ref 0–44)
AST: 18 U/L (ref 15–41)
Albumin: 4.2 g/dL (ref 3.5–5.0)
Alkaline Phosphatase: 94 U/L (ref 38–126)
Anion gap: 5 (ref 5–15)
BUN: 14 mg/dL (ref 8–23)
CO2: 29 mmol/L (ref 22–32)
Calcium: 10.3 mg/dL (ref 8.9–10.3)
Chloride: 108 mmol/L (ref 98–111)
Creatinine: 0.96 mg/dL (ref 0.44–1.00)
GFR, Estimated: >60 mL/min (ref >60)
Glucose, Bld: 95 mg/dL (ref 70–99)
Potassium: 4.3 mmol/L (ref 3.5–5.1)
Sodium: 142 mmol/L (ref 135–145)
Total Bilirubin: 1.6 mg/dL — ABNORMAL HIGH (ref 0.0–1.2)
Total Protein: 6.6 g/dL (ref 6.5–8.1)

## 2023-12-05 DIAGNOSIS — Z0000 Encounter for general adult medical examination without abnormal findings: Secondary | ICD-10-CM | POA: Diagnosis not present

## 2023-12-05 DIAGNOSIS — M8588 Other specified disorders of bone density and structure, other site: Secondary | ICD-10-CM | POA: Diagnosis not present

## 2023-12-05 DIAGNOSIS — Z23 Encounter for immunization: Secondary | ICD-10-CM | POA: Diagnosis not present

## 2023-12-05 DIAGNOSIS — Z1331 Encounter for screening for depression: Secondary | ICD-10-CM | POA: Diagnosis not present

## 2023-12-05 DIAGNOSIS — Z6836 Body mass index (BMI) 36.0-36.9, adult: Secondary | ICD-10-CM | POA: Diagnosis not present

## 2023-12-05 DIAGNOSIS — I10 Essential (primary) hypertension: Secondary | ICD-10-CM | POA: Diagnosis not present

## 2023-12-05 DIAGNOSIS — D473 Essential (hemorrhagic) thrombocythemia: Secondary | ICD-10-CM | POA: Diagnosis not present

## 2024-01-01 DIAGNOSIS — Z01419 Encounter for gynecological examination (general) (routine) without abnormal findings: Secondary | ICD-10-CM | POA: Diagnosis not present

## 2024-01-01 DIAGNOSIS — Z1231 Encounter for screening mammogram for malignant neoplasm of breast: Secondary | ICD-10-CM | POA: Diagnosis not present

## 2024-01-01 DIAGNOSIS — Z124 Encounter for screening for malignant neoplasm of cervix: Secondary | ICD-10-CM | POA: Diagnosis not present

## 2024-01-01 DIAGNOSIS — Z1331 Encounter for screening for depression: Secondary | ICD-10-CM | POA: Diagnosis not present

## 2024-01-05 DIAGNOSIS — M85852 Other specified disorders of bone density and structure, left thigh: Secondary | ICD-10-CM | POA: Diagnosis not present

## 2024-01-23 ENCOUNTER — Inpatient Hospital Stay (HOSPITAL_BASED_OUTPATIENT_CLINIC_OR_DEPARTMENT_OTHER): Admitting: Hematology and Oncology

## 2024-01-23 ENCOUNTER — Inpatient Hospital Stay: Attending: Adult Health

## 2024-01-23 VITALS — BP 128/70 | HR 68 | Temp 97.8°F | Resp 17 | Ht 65.0 in

## 2024-01-23 DIAGNOSIS — Z7982 Long term (current) use of aspirin: Secondary | ICD-10-CM | POA: Insufficient documentation

## 2024-01-23 DIAGNOSIS — Z7964 Long term (current) use of myelosuppressive agent: Secondary | ICD-10-CM | POA: Insufficient documentation

## 2024-01-23 DIAGNOSIS — D473 Essential (hemorrhagic) thrombocythemia: Secondary | ICD-10-CM | POA: Insufficient documentation

## 2024-01-23 DIAGNOSIS — Z800 Family history of malignant neoplasm of digestive organs: Secondary | ICD-10-CM | POA: Diagnosis not present

## 2024-01-23 DIAGNOSIS — D75839 Thrombocytosis, unspecified: Secondary | ICD-10-CM | POA: Insufficient documentation

## 2024-01-23 DIAGNOSIS — Z87891 Personal history of nicotine dependence: Secondary | ICD-10-CM | POA: Insufficient documentation

## 2024-01-23 LAB — CBC WITH DIFFERENTIAL (CANCER CENTER ONLY)
Abs Immature Granulocytes: 0.01 K/uL (ref 0.00–0.07)
Basophils Absolute: 0 K/uL (ref 0.0–0.1)
Basophils Relative: 1 %
Eosinophils Absolute: 0 K/uL (ref 0.0–0.5)
Eosinophils Relative: 1 %
HCT: 35.7 % — ABNORMAL LOW (ref 36.0–46.0)
Hemoglobin: 12.5 g/dL (ref 12.0–15.0)
Immature Granulocytes: 0 %
Lymphocytes Relative: 33 %
Lymphs Abs: 0.9 K/uL (ref 0.7–4.0)
MCH: 39.6 pg — ABNORMAL HIGH (ref 26.0–34.0)
MCHC: 35 g/dL (ref 30.0–36.0)
MCV: 113 fL — ABNORMAL HIGH (ref 80.0–100.0)
Monocytes Absolute: 0.3 K/uL (ref 0.1–1.0)
Monocytes Relative: 9 %
Neutro Abs: 1.5 K/uL — ABNORMAL LOW (ref 1.7–7.7)
Neutrophils Relative %: 56 %
Platelet Count: 251 K/uL (ref 150–400)
RBC: 3.16 MIL/uL — ABNORMAL LOW (ref 3.87–5.11)
RDW: 12.6 % (ref 11.5–15.5)
WBC Count: 2.8 K/uL — ABNORMAL LOW (ref 4.0–10.5)
nRBC: 0 % (ref 0.0–0.2)

## 2024-01-23 LAB — CMP (CANCER CENTER ONLY)
ALT: 20 U/L (ref 0–44)
AST: 19 U/L (ref 15–41)
Albumin: 4 g/dL (ref 3.5–5.0)
Alkaline Phosphatase: 93 U/L (ref 38–126)
Anion gap: 4 — ABNORMAL LOW (ref 5–15)
BUN: 18 mg/dL (ref 8–23)
CO2: 28 mmol/L (ref 22–32)
Calcium: 10.3 mg/dL (ref 8.9–10.3)
Chloride: 108 mmol/L (ref 98–111)
Creatinine: 0.98 mg/dL (ref 0.44–1.00)
GFR, Estimated: >60 mL/min (ref >60)
Glucose, Bld: 112 mg/dL — ABNORMAL HIGH (ref 70–99)
Potassium: 4.5 mmol/L (ref 3.5–5.1)
Sodium: 140 mmol/L (ref 135–145)
Total Bilirubin: 1.7 mg/dL — ABNORMAL HIGH (ref 0.0–1.2)
Total Protein: 6.4 g/dL — ABNORMAL LOW (ref 6.5–8.1)

## 2024-01-23 MED ORDER — HYDROXYUREA 500 MG PO CAPS: 500.0000 mg | ORAL_CAPSULE | Freq: Three times a day (TID) | ORAL | 2 refills | Status: AC

## 2024-01-23 NOTE — Progress Notes (Unsigned)
 [facility] Telephone:(336) [phone]   Fax:(336) [phone]  PROGRESS NOTE  Patient Care Team: Sun, Vyvyan, MD as PCP - General (Family Medicine)  Hematological/Oncological History # Essential Thrombocytosis, JAK2 positive  08/23/2019: NGS confirms JAK2 V617F 09/09/2019: Bone marrow biopsy results consistent with a  myeloproliferative neoplasm particularly essential thrombocythemia 01/23/2021: last visit with Dr. Layla 01/23/2022: transition care to Dr. Federico  04/24/2022: increase to hydroxyurea  500 mg in AM and 1000 mg in PM 07/25/2022: white blood cell 4.6, hemoglobin 13.4, MCV 108.3, and platelets of 333  Interval History:  Lisa Buckley 68 y.o. female with medical history significant for essential thrombocytosis who presents for a follow up visit. The patient's last visit was on 07/24/2023. In the interim since the last visit she has continued on hydroxyurea  therapy.   On exam today Lisa Buckley reports she has been well overall since our last visit.  She reports that she has had no recent trouble with her health.  She reports that she is currently taking her hydroxyurea  as prescribed with 1 in the morning and 2 in the evening.  She notes that she is not having any major side effects on the medication other than some darkening of her toenails.  She is not currently having any nausea, vomiting, or diarrhea.  She denies any ulcers in the mouth.  She reports her energy levels are good and her weight is steady.  She is not currently having any signs or symptoms concerning for blood clot such as leg swelling, leg pain, chest pain, or shortness of breath.  She does have some occasional bruising but no bleeding.  Overall she is willing and able to continue on hydroxyurea  therapy at this time.  A full 10 point ROS is otherwise negative.  MEDICAL HISTORY:  Past Medical History:  Diagnosis Date   HTN (hypertension)    Seasonal allergies     SURGICAL HISTORY: Past Surgical  History:  Procedure Laterality Date   ABDOMINAL HYSTERECTOMY  1992    SOCIAL HISTORY: Social History   Socioeconomic History   Marital status: Married    Spouse name: Not on file   Number of children: Not on file   Years of education: Not on file   Highest education level: Not on file  Occupational History   Not on file  Tobacco Use   Smoking status: Former   Smokeless tobacco: Never  Substance and Sexual Activity   Alcohol use: Yes    Comment: rarely   Drug use: Not on file   Sexual activity: Not on file  Other Topics Concern   Not on file  Social History Narrative   Not on file   Social Drivers of Health   Financial Resource Strain: Not on file  Food Insecurity: No Food Insecurity (06/20/2023)   Hunger Vital Sign    Worried About Running Out of Food in the Last Year: Never true    Ran Out of Food in the Last Year: Never true  Transportation Needs: No Transportation Needs (06/20/2023)   PRAPARE - Administrator, Civil Service (Medical): No    Lack of Transportation (Non-Medical): No  Physical Activity: Not on file  Stress: Not on file  Social Connections: Not on file  Intimate Partner Violence: Not At Risk (06/20/2023)   Humiliation, Afraid, Rape, and Kick questionnaire    Fear of Current or Ex-Partner: No    Emotionally Abused: No    Physically Abused: No    Sexually Abused: No  FAMILY HISTORY: Family History  Problem Relation Age of Onset   Hypertension Mother    Hypertension Father    Colon cancer Sister    Colon cancer Brother     ALLERGIES:  is allergic to morphine and codeine.  MEDICATIONS:  Current Outpatient Medications  Medication Sig Dispense Refill   aspirin  EC 81 MG tablet Take 1 tablet (81 mg total) by mouth daily. Swallow whole.     esomeprazole (NEXIUM) 40 MG capsule Take 40 mg by mouth as needed.     hydroxyurea  (HYDREA ) 500 MG capsule Take 1 capsule (500 mg total) by mouth in the morning, at noon, and at bedtime. May take  with food to minimize GI side effects. 270 capsule 2   meloxicam  (MOBIC ) 15 MG tablet Take 15 mg by mouth daily.     telmisartan-hydrochlorothiazide (MICARDIS HCT) 80-25 MG tablet TK 1 T PO QD  1   No current facility-administered medications for this visit.    REVIEW OF SYSTEMS:   Constitutional: ( - ) fevers, ( - )  chills , ( - ) night sweats Eyes: ( - ) blurriness of vision, ( - ) double vision, ( - ) watery eyes Ears, nose, mouth, throat, and face: ( - ) mucositis, ( - ) sore throat Respiratory: ( - ) cough, ( - ) dyspnea, ( - ) wheezes Cardiovascular: ( - ) palpitation, ( - ) chest discomfort, ( - ) lower extremity swelling Gastrointestinal:  ( - ) nausea, ( - ) heartburn, ( - ) change in bowel habits Skin: ( - ) abnormal skin rashes Lymphatics: ( - ) new lymphadenopathy, ( - ) easy bruising Neurological: ( - ) numbness, ( - ) tingling, ( - ) new weaknesses Behavioral/Psych: ( - ) mood change, ( - ) new changes  All other systems were reviewed with the patient and are negative.  PHYSICAL EXAMINATION:  Vitals:   01/23/24 1043  BP: 128/70  Pulse: 68  Resp: 17  Temp: 97.8 F (36.6 C)  SpO2: 100%    There were no vitals filed for this visit.   GENERAL: Well-appearing middle-aged African-American female, alert, no distress and comfortable SKIN: skin color, texture, turgor are normal, no rashes or significant lesions EYES: conjunctiva are pink and non-injected, sclera clear LUNGS: clear to auscultation and percussion with normal breathing effort HEART: regular rate & rhythm and no murmurs and no lower extremity edema Musculoskeletal: no cyanosis of digits and no clubbing  PSYCH: alert & oriented x 3, fluent speech NEURO: no focal motor/sensory deficits  LABORATORY DATA:  I have reviewed the data as listed    Latest Ref Rng & Units 01/23/2024   10:11 AM 10/24/2023    9:50 AM 07/24/2023   10:06 AM  CBC  WBC 4.0 - 10.5 K/uL 2.8  3.1  3.0   Hemoglobin 12.0 - 15.0 g/dL  87.4  87.2  87.2   Hematocrit 36.0 - 46.0 % 35.7  35.8  36.8   Platelets 150 - 400 K/uL 251  248  239        Latest Ref Rng & Units 01/23/2024   10:11 AM 10/24/2023    9:50 AM 07/24/2023   10:06 AM  CMP  Glucose 70 - 99 mg/dL 887  95  98   BUN 8 - 23 mg/dL 18  14  15    Creatinine 0.44 - 1.00 mg/dL 9.01  9.03  9.01   Sodium 135 - 145 mmol/L 140  142  140  Potassium 3.5 - 5.1 mmol/L 4.5  4.3  4.4   Chloride 98 - 111 mmol/L 108  108  108   CO2 22 - 32 mmol/L 28  29  31    Calcium 8.9 - 10.3 mg/dL 89.6  89.6  9.9   Total Protein 6.5 - 8.1 g/dL 6.4  6.6  6.5   Total Bilirubin 0.0 - 1.2 mg/dL 1.7  1.6  1.4   Alkaline Phos 38 - 126 U/L 93  94  85   AST 15 - 41 U/L 19  18  16    ALT 0 - 44 U/L 20  17  16     RADIOGRAPHIC STUDIES: No results found.  ASSESSMENT & PLAN Brian Kocourek Debold 68 y.o. female with medical history significant for essential thrombocytosis who presents for a follow up visit.   At this time Mrs. Barse's findings are consistent with a JAK2 positive essential thrombocytosis, confirmed by bone marrow biopsy.  She is currently on hydroxyurea  therapy which is well-tolerated and controlling her platelets well.  Overall we will continue on this treatment plan as started by Dr. Layla.  # Essential Thrombocytosis, JAK2 positive  --continue hydroxyurea  500 mg in the AM and 1000 mg in the PM for cytoreductive therapy --continue ASA 81 mg PO daily --labs today show white blood cell 2.8, hemoglobin 12.5, MCV 113, platelets 251 -- Return to clinic in 6 months time with an interval 48-month lab  No orders of the defined types were placed in this encounter.   All questions were answered. The patient knows to call the clinic with any problems, questions or concerns.  A total of more than 30 minutes were spent on this encounter with face-to-face time and non-face-to-face time, including preparing to see the patient, ordering tests and/or medications, counseling the patient and  coordination of care as outlined above.   Norleen IVAR Kidney, MD Department of Hematology/Oncology [facility] at [facility] Phone: [phone] Pager: [phone] Email: [email or web address]  01/25/2024 6:25 PM

## 2024-03-26 DIAGNOSIS — M25571 Pain in right ankle and joints of right foot: Secondary | ICD-10-CM | POA: Diagnosis not present

## 2024-03-26 DIAGNOSIS — M76821 Posterior tibial tendinitis, right leg: Secondary | ICD-10-CM | POA: Diagnosis not present

## 2024-03-26 DIAGNOSIS — M79671 Pain in right foot: Secondary | ICD-10-CM | POA: Diagnosis not present

## 2024-03-26 DIAGNOSIS — G8929 Other chronic pain: Secondary | ICD-10-CM | POA: Diagnosis not present

## 2024-04-14 DIAGNOSIS — G8929 Other chronic pain: Secondary | ICD-10-CM | POA: Diagnosis not present

## 2024-04-14 DIAGNOSIS — M25571 Pain in right ankle and joints of right foot: Secondary | ICD-10-CM | POA: Diagnosis not present

## 2024-04-23 ENCOUNTER — Other Ambulatory Visit: Payer: Self-pay | Admitting: Hematology and Oncology

## 2024-04-23 ENCOUNTER — Inpatient Hospital Stay: Attending: Adult Health

## 2024-04-23 DIAGNOSIS — D473 Essential (hemorrhagic) thrombocythemia: Secondary | ICD-10-CM | POA: Insufficient documentation

## 2024-04-23 DIAGNOSIS — D75839 Thrombocytosis, unspecified: Secondary | ICD-10-CM | POA: Diagnosis not present

## 2024-04-23 LAB — CMP (CANCER CENTER ONLY)
ALT: 14 U/L (ref 0–44)
AST: 21 U/L (ref 15–41)
Albumin: 4.1 g/dL (ref 3.5–5.0)
Alkaline Phosphatase: 96 U/L (ref 38–126)
Anion gap: 7 (ref 5–15)
BUN: 17 mg/dL (ref 8–23)
CO2: 26 mmol/L (ref 22–32)
Calcium: 10.3 mg/dL (ref 8.9–10.3)
Chloride: 107 mmol/L (ref 98–111)
Creatinine: 1.05 mg/dL — ABNORMAL HIGH (ref 0.44–1.00)
GFR, Estimated: 57 mL/min — ABNORMAL LOW (ref >60)
Glucose, Bld: 89 mg/dL (ref 70–99)
Potassium: 4.2 mmol/L (ref 3.5–5.1)
Sodium: 141 mmol/L (ref 135–145)
Total Bilirubin: 1.2 mg/dL (ref 0.0–1.2)
Total Protein: 6.4 g/dL — ABNORMAL LOW (ref 6.5–8.1)

## 2024-04-23 LAB — CBC WITH DIFFERENTIAL (CANCER CENTER ONLY)
Abs Immature Granulocytes: 0.01 K/uL (ref 0.00–0.07)
Basophils Absolute: 0 K/uL (ref 0.0–0.1)
Basophils Relative: 1 %
Eosinophils Absolute: 0.1 K/uL (ref 0.0–0.5)
Eosinophils Relative: 2 %
HCT: 32.7 % — ABNORMAL LOW (ref 36.0–46.0)
Hemoglobin: 11.8 g/dL — ABNORMAL LOW (ref 12.0–15.0)
Immature Granulocytes: 0 %
Lymphocytes Relative: 32 %
Lymphs Abs: 1 K/uL (ref 0.7–4.0)
MCH: 40.5 pg — ABNORMAL HIGH (ref 26.0–34.0)
MCHC: 36.1 g/dL — ABNORMAL HIGH (ref 30.0–36.0)
MCV: 112.4 fL — ABNORMAL HIGH (ref 80.0–100.0)
Monocytes Absolute: 0.3 K/uL (ref 0.1–1.0)
Monocytes Relative: 9 %
Neutro Abs: 1.8 K/uL (ref 1.7–7.7)
Neutrophils Relative %: 56 %
Platelet Count: 233 K/uL (ref 150–400)
RBC: 2.91 MIL/uL — ABNORMAL LOW (ref 3.87–5.11)
RDW: 12.7 % (ref 11.5–15.5)
WBC Count: 3.1 K/uL — ABNORMAL LOW (ref 4.0–10.5)
nRBC: 0 % (ref 0.0–0.2)

## 2024-07-23 ENCOUNTER — Ambulatory Visit: Admitting: Hematology and Oncology

## 2024-07-23 ENCOUNTER — Other Ambulatory Visit
